# Patient Record
Sex: Male | Born: 1990 | Race: Black or African American | Hispanic: No | Marital: Single | Smoking: Current every day smoker
Health system: Southern US, Community
[De-identification: ages and names within clinical notes are randomized; demographics above are authoritative.]

## PROBLEM LIST (undated history)

## (undated) DIAGNOSIS — J45909 Unspecified asthma, uncomplicated: Secondary | ICD-10-CM

## (undated) DIAGNOSIS — E860 Dehydration: Secondary | ICD-10-CM

## (undated) HISTORY — PX: APPENDECTOMY: SHX54

---
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
## 2012-11-30 DIAGNOSIS — J45909 Unspecified asthma, uncomplicated: Secondary | ICD-10-CM | POA: Insufficient documentation

## 2012-11-30 DIAGNOSIS — Z9089 Acquired absence of other organs: Secondary | ICD-10-CM | POA: Insufficient documentation

## 2012-11-30 DIAGNOSIS — R1013 Epigastric pain: Secondary | ICD-10-CM | POA: Insufficient documentation

## 2012-11-30 DIAGNOSIS — R112 Nausea with vomiting, unspecified: Secondary | ICD-10-CM | POA: Insufficient documentation

## 2012-12-01 ENCOUNTER — Emergency Department (HOSPITAL_COMMUNITY)
Admission: EM | Admit: 2012-12-01 | Discharge: 2012-12-01 | Disposition: A | Discharge status: Home / Self Care | Payer: Self-pay | Attending: Emergency Medicine | Admitting: Emergency Medicine

## 2012-12-01 ENCOUNTER — Encounter (HOSPITAL_COMMUNITY): Payer: Self-pay | Admitting: Emergency Medicine

## 2012-12-01 ENCOUNTER — Emergency Department (HOSPITAL_COMMUNITY): Payer: Self-pay

## 2012-12-01 DIAGNOSIS — R1013 Epigastric pain: Secondary | ICD-10-CM

## 2012-12-01 HISTORY — DX: Unspecified asthma, uncomplicated: J45.909

## 2012-12-01 LAB — CBC WITH DIFFERENTIAL/PLATELET
Basophils Absolute: 0 10*3/uL (ref 0.0–0.1)
Basophils Relative: 0 % (ref 0–1)
Eosinophils Absolute: 0 10*3/uL (ref 0.0–0.7)
Eosinophils Relative: 0 % (ref 0–5)
HCT: 43.4 % (ref 39.0–52.0)
Hemoglobin: 15.1 g/dL (ref 13.0–17.0)
Lymphocytes Relative: 8 % — ABNORMAL LOW (ref 12–46)
Lymphs Abs: 1 10*3/uL (ref 0.7–4.0)
MCH: 28.9 pg (ref 26.0–34.0)
MCHC: 34.8 g/dL (ref 30.0–36.0)
MCV: 83.1 fL (ref 78.0–100.0)
Monocytes Absolute: 1 10*3/uL (ref 0.1–1.0)
Monocytes Relative: 8 % (ref 3–12)
Neutro Abs: 10.9 10*3/uL — ABNORMAL HIGH (ref 1.7–7.7)
Neutrophils Relative %: 84 % — ABNORMAL HIGH (ref 43–77)
Platelets: 250 10*3/uL (ref 150–400)
RBC: 5.22 MIL/uL (ref 4.22–5.81)
RDW: 13.7 % (ref 11.5–15.5)
WBC: 13 10*3/uL — ABNORMAL HIGH (ref 4.0–10.5)

## 2012-12-01 LAB — COMPREHENSIVE METABOLIC PANEL
ALT: 37 U/L (ref 0–53)
AST: 40 U/L — ABNORMAL HIGH (ref 0–37)
Albumin: 4.6 g/dL (ref 3.5–5.2)
Alkaline Phosphatase: 74 U/L (ref 39–117)
BUN: 14 mg/dL (ref 6–23)
CO2: 27 mEq/L (ref 19–32)
Calcium: 9.8 mg/dL (ref 8.4–10.5)
Chloride: 99 mEq/L (ref 96–112)
Creatinine, Ser: 0.93 mg/dL (ref 0.50–1.35)
GFR calc Af Amer: >90 mL/min (ref >90)
GFR calc non Af Amer: >90 mL/min (ref >90)
Glucose, Bld: 103 mg/dL — ABNORMAL HIGH (ref 70–99)
Potassium: 3.5 mEq/L (ref 3.5–5.1)
Sodium: 140 mEq/L (ref 135–145)
Total Bilirubin: 0.4 mg/dL (ref 0.3–1.2)
Total Protein: 8.7 g/dL — ABNORMAL HIGH (ref 6.0–8.3)

## 2012-12-01 MED ORDER — FAMOTIDINE 20 MG PO TABS: 20.0000 mg | ORAL_TABLET | Freq: Two times a day (BID) | ORAL | Status: DC

## 2012-12-01 MED ORDER — GI COCKTAIL ~~LOC~~
30.0000 mL | Freq: Once | ORAL | Status: AC
  Administered 2012-12-01: 30 mL via ORAL
  Filled 2012-12-01: qty 30

## 2012-12-01 MED ORDER — SODIUM CHLORIDE 0.9 % IV BOLUS (SEPSIS)
1000.0000 mL | Freq: Once | INTRAVENOUS | Status: AC
  Administered 2012-12-01: 1000 mL via INTRAVENOUS

## 2012-12-01 MED ORDER — PANTOPRAZOLE SODIUM 40 MG IV SOLR
40.0000 mg | Freq: Once | INTRAVENOUS | Status: AC
  Administered 2012-12-01: 40 mg via INTRAVENOUS
  Filled 2012-12-01: qty 40

## 2012-12-01 MED ORDER — FAMOTIDINE 20 MG PO TABS
20.0000 mg | ORAL_TABLET | Freq: Once | ORAL | Status: AC
  Administered 2012-12-01: 20 mg via ORAL
  Filled 2012-12-01: qty 1

## 2012-12-01 MED ORDER — ONDANSETRON HCL 4 MG/2ML IJ SOLN
4.0000 mg | Freq: Once | INTRAMUSCULAR | Status: AC
  Administered 2012-12-01: 4 mg via INTRAVENOUS
  Filled 2012-12-01: qty 2

## 2012-12-01 MED ORDER — MORPHINE SULFATE 4 MG/ML IJ SOLN
4.0000 mg | Freq: Once | INTRAMUSCULAR | Status: AC
  Administered 2012-12-01: 4 mg via INTRAVENOUS
  Filled 2012-12-01: qty 1

## 2012-12-01 NOTE — ED Provider Notes (Signed)
History     CSN: [id number]  Arrival date & time 11/30/12  2358   First MD Initiated Contact with Patient 12/01/12 0130      Chief Complaint  Patient presents with  . Abdominal Pain    (Consider location/radiation/quality/duration/timing/severity/associated sxs/prior treatment) HPI Hx per PT. Epigastric pain onset tonight after eating [employer] chicken meal, feels like indigestion, some nausea and vomiting x 4, no blood. No diarrhea. Now persistent sharp pain, no back pain. Mod in severity. No h/o same.  Past Medical History  Diagnosis Date  . Asthma     Past Surgical History  Procedure Laterality Date  . Appendectomy      No family history on file.  History  Substance Use Topics  . Smoking status: Never Smoker   . Smokeless tobacco: Not on file  . Alcohol Use: Yes     Comment: occasionally      Review of Systems  Constitutional: Negative for fever and chills.  HENT: Negative for neck pain and neck stiffness.   Eyes: Negative for pain.  Respiratory: Negative for shortness of breath.   Cardiovascular: Negative for chest pain.  Gastrointestinal: Positive for vomiting and abdominal pain. Negative for blood in stool.  Genitourinary: Negative for dysuria.  Musculoskeletal: Negative for back pain.  Skin: Negative for rash.  Neurological: Negative for headaches.  All other systems reviewed and are negative.    Allergies  Review of patient's allergies indicates not on file.  Home Medications  No current outpatient prescriptions on file.  BP 132/72  Pulse 92  Temp(Src) 97.5 F (36.4 C) (Oral)  Resp 16  SpO2 100%  Physical Exam  Constitutional: He is oriented to person, place, and time. He appears well-developed and well-nourished.  HENT:  Head: Normocephalic and atraumatic.  Eyes: Conjunctivae and EOM are normal. Pupils are equal, round, and reactive to light. No scleral icterus.  Neck: Neck supple.  Cardiovascular: Normal rate, regular rhythm and intact  distal pulses.   Pulmonary/Chest: Effort normal and breath sounds normal. No respiratory distress.  Abdominal: Soft. Bowel sounds are normal. He exhibits no distension. There is no rebound and no guarding.  TTP epigastric  Musculoskeletal: Normal range of motion. He exhibits no edema.  Neurological: He is alert and oriented to person, place, and time.  Skin: Skin is warm and dry.    ED Course  Procedures (including critical care time)  Results for orders placed during the hospital encounter of 12/01/12  CBC WITH DIFFERENTIAL      Result Value Range   WBC 13.0 (*) 4.0 - 10.5 K/uL   RBC 5.22  4.22 - 5.81 MIL/uL   Hemoglobin 15.1  13.0 - 17.0 g/dL   HCT 14.7  82.9 - 56.2 %   MCV 83.1  78.0 - 100.0 fL   MCH 28.9  26.0 - 34.0 pg   MCHC 34.8  30.0 - 36.0 g/dL   RDW 13.0  86.5 - 78.4 %   Platelets 250  150 - 400 K/uL   Neutrophils Relative 84 (*) 43 - 77 %   Neutro Abs 10.9 (*) 1.7 - 7.7 K/uL   Lymphocytes Relative 8 (*) 12 - 46 %   Lymphs Abs 1.0  0.7 - 4.0 K/uL   Monocytes Relative 8  3 - 12 %   Monocytes Absolute 1.0  0.1 - 1.0 K/uL   Eosinophils Relative 0  0 - 5 %   Eosinophils Absolute 0.0  0.0 - 0.7 K/uL   Basophils Relative 0  0 - 1 %   Basophils Absolute 0.0  0.0 - 0.1 K/uL  COMPREHENSIVE METABOLIC PANEL      Result Value Range   Sodium 140  135 - 145 mEq/L   Potassium 3.5  3.5 - 5.1 mEq/L   Chloride 99  96 - 112 mEq/L   CO2 27  19 - 32 mEq/L   Glucose, Bld 103 (*) 70 - 99 mg/dL   BUN 14  6 - 23 mg/dL   Creatinine, Ser 4.09  0.50 - 1.35 mg/dL   Calcium 9.8  8.4 - 81.1 mg/dL   Total Protein 8.7 (*) 6.0 - 8.3 g/dL   Albumin 4.6  3.5 - 5.2 g/dL   AST 40 (*) 0 - 37 U/L   ALT 37  0 - 53 U/L   Alkaline Phosphatase 74  39 - 117 U/L   Total Bilirubin 0.4  0.3 - 1.2 mg/dL   GFR calc non Af Amer >90  >90 mL/min   GFR calc Af Amer >90  >90 mL/min  LIPASE, BLOOD      Result Value Range   Lipase 26  11 - 59 U/L   US Abdomen Complete  12/01/2012  *RADIOLOGY REPORT*   Clinical Data:  Upper abdominal pain.  ABDOMINAL ULTRASOUND COMPLETE  Comparison:  None  Findings:  Gallbladder:  The gallbladder is normal in appearance, without evidence for gallstones, gallbladder wall thickening or pericholecystic fluid.  No ultrasonographic Murphy's sign is elicited.  A small Phrygian cap is noted.  Common Bile Duct:  0.3 cm in diameter; within normal limits in caliber.  Liver:  Normal parenchymal echogenicity and echotexture; no focal lesions identified.  Limited Doppler evaluation demonstrates normal blood flow within the liver.  IVC:  Unremarkable in appearance.  Pancreas:  Although the pancreas is difficult to visualize in its entirety due to overlying bowel gas, no focal pancreatic abnormality is identified.  Spleen:  9.9 cm in length; within normal limits in size and echotexture.  Right kidney:  10.2 cm in length; normal in size, configuration and parenchymal echogenicity.  No evidence of mass or hydronephrosis.  Left kidney:  10.5 cm in length; normal in size, configuration and parenchymal echogenicity.  No evidence of mass or hydronephrosis.  Abdominal Aorta:  Normal in caliber; no aneurysm identified.  IMPRESSION: Unremarkable abdominal ultrasound.   Original Report Authenticated By: Tonia Ghent, M.D.     IV fluids. IV morphine. IV Zofran. IV Protonix. GI cocktail. Pepcid  3:37 AM recheck: feeling better, especially after GI cocktail. Plan d/c home Rx peppcid, GERD precautions, GI referral as needed MDM  Epigastric pain  [place]. Labs reviewed  Improved with medications provided  VS and nursing notes reviewed/ considered       Sunnie Nielsen, MD 12/01/12 [phone]

## 2012-12-01 NOTE — ED Notes (Signed)
Pt transported to US

## 2012-12-01 NOTE — ED Notes (Signed)
Pt abdominal pain with N/V x 2 hours, not long after eating

## 2012-12-01 NOTE — ED Notes (Signed)
MD at the bedside  

## 2013-12-02 ENCOUNTER — Encounter (HOSPITAL_COMMUNITY): Payer: Self-pay | Admitting: Emergency Medicine

## 2013-12-02 ENCOUNTER — Emergency Department (INDEPENDENT_AMBULATORY_CARE_PROVIDER_SITE_OTHER)
Admission: EM | Admit: 2013-12-02 | Discharge: 2013-12-02 | Disposition: A | Discharge status: Home / Self Care | Payer: Self-pay | Source: Home / Self Care

## 2013-12-02 ENCOUNTER — Emergency Department (HOSPITAL_COMMUNITY)
Admission: EM | Admit: 2013-12-02 | Discharge: 2013-12-02 | Discharge status: Against Medical Advice | Payer: Self-pay | Attending: Emergency Medicine | Admitting: Emergency Medicine

## 2013-12-02 DIAGNOSIS — R112 Nausea with vomiting, unspecified: Secondary | ICD-10-CM

## 2013-12-02 DIAGNOSIS — K2990 Gastroduodenitis, unspecified, without bleeding: Secondary | ICD-10-CM

## 2013-12-02 DIAGNOSIS — R630 Anorexia: Secondary | ICD-10-CM | POA: Insufficient documentation

## 2013-12-02 DIAGNOSIS — K2970 Gastritis, unspecified, without bleeding: Secondary | ICD-10-CM

## 2013-12-02 DIAGNOSIS — J45909 Unspecified asthma, uncomplicated: Secondary | ICD-10-CM | POA: Insufficient documentation

## 2013-12-02 DIAGNOSIS — R109 Unspecified abdominal pain: Secondary | ICD-10-CM | POA: Insufficient documentation

## 2013-12-02 LAB — CBC WITH DIFFERENTIAL/PLATELET
Basophils Absolute: 0 K/uL (ref 0.0–0.1)
Basophils Relative: 0 % (ref 0–1)
Eosinophils Absolute: 0 K/uL (ref 0.0–0.7)
Eosinophils Relative: 0 % (ref 0–5)
HCT: 44.8 % (ref 39.0–52.0)
Hemoglobin: 15.4 g/dL (ref 13.0–17.0)
Lymphocytes Relative: 14 % (ref 12–46)
Lymphs Abs: 1.2 K/uL (ref 0.7–4.0)
MCH: 29.2 pg (ref 26.0–34.0)
MCHC: 34.4 g/dL (ref 30.0–36.0)
MCV: 84.8 fL (ref 78.0–100.0)
Monocytes Absolute: 0.4 K/uL (ref 0.1–1.0)
Monocytes Relative: 4 % (ref 3–12)
Neutro Abs: 7.5 K/uL (ref 1.7–7.7)
Neutrophils Relative %: 82 % — ABNORMAL HIGH (ref 43–77)
Platelets: 259 K/uL (ref 150–400)
RBC: 5.28 MIL/uL (ref 4.22–5.81)
RDW: 13 % (ref 11.5–15.5)
WBC: 9.2 K/uL (ref 4.0–10.5)

## 2013-12-02 LAB — COMPREHENSIVE METABOLIC PANEL
ALK PHOS: 88 U/L (ref 39–117)
ALT: 19 U/L (ref 0–53)
AST: 24 U/L (ref 0–37)
Albumin: 5 g/dL (ref 3.5–5.2)
BILIRUBIN TOTAL: 0.5 mg/dL (ref 0.3–1.2)
BUN: 12 mg/dL (ref 6–23)
CHLORIDE: 99 meq/L (ref 96–112)
CO2: 29 meq/L (ref 19–32)
Calcium: 10.4 mg/dL (ref 8.4–10.5)
Creatinine, Ser: 0.85 mg/dL (ref 0.50–1.35)
GFR calc Af Amer: >90 mL/min (ref >90)
GLUCOSE: 93 mg/dL (ref 70–99)
POTASSIUM: 3.9 meq/L (ref 3.7–5.3)
SODIUM: 141 meq/L (ref 137–147)
Total Protein: 8.9 g/dL — ABNORMAL HIGH (ref 6.0–8.3)

## 2013-12-02 LAB — LIPASE, BLOOD: Lipase: 55 U/L (ref 11–59)

## 2013-12-02 MED ORDER — OMEPRAZOLE 20 MG PO CPDR: 20.0000 mg | DELAYED_RELEASE_CAPSULE | Freq: Every day | ORAL | Status: DC

## 2013-12-02 MED ORDER — ONDANSETRON HCL 4 MG PO TABS: 4.0000 mg | ORAL_TABLET | Freq: Four times a day (QID) | ORAL | Status: DC

## 2013-12-02 NOTE — ED Notes (Signed)
States had had a "few alcoholic drinks last night' then today became nauseated and began vomiting and has been unable to tolerate any oral intake since. Every time he tries to eat or drink he vomits and hes having upper abd pain

## 2013-12-02 NOTE — ED Notes (Signed)
Pt states also has a hx of irregular BM.  States goes once every 5 days.  Mw,cma

## 2013-12-02 NOTE — ED Notes (Signed)
Called pt to retake vital signs. No one answered to his name.

## 2013-12-02 NOTE — ED Provider Notes (Signed)
Medical screening examination/treatment/procedure(s) were performed by resident physician or non-physician practitioner and as supervising physician I was immediately available for consultation/collaboration.   Curry Dulski DOUGLAS MD.   Aime Meloche D Kingslee Dowse, MD 12/02/13 1708 

## 2013-12-02 NOTE — ED Provider Notes (Signed)
CSN: [id number]     Arrival date & time 12/02/13  1412 History   First MD Initiated Contact with Patient 12/02/13 1440     Chief Complaint  Patient presents with  . Abdominal Pain   (Consider location/radiation/quality/duration/timing/severity/associated sxs/prior Treatment) HPI Comments: 23 year old male coming back from the beach in the last 24 hours and developed epigastric discomfort associated with nausea and vomiting approximately 2 hours ago. He denies diarrhea. Denies fever, chest pain or other symptoms. He does have a history of asthma.   Past Medical History  Diagnosis Date  . Asthma    Past Surgical History  Procedure Laterality Date  . Appendectomy     History reviewed. No pertinent family history. History  Substance Use Topics  . Smoking status: Never Smoker   . Smokeless tobacco: Not on file  . Alcohol Use: Yes     Comment: occasionally    Review of Systems  Constitutional: Negative.   Respiratory: Negative.   Cardiovascular: Negative.   Gastrointestinal: Positive for nausea, vomiting, abdominal pain and constipation. Negative for diarrhea and abdominal distention.  Genitourinary: Negative.   Neurological: Negative.     Allergies  Robitussin nighttime cough dm  Home Medications   Current Outpatient Rx  Name  Route  Sig  Dispense  Refill  . famotidine (PEPCID) 20 MG tablet   Oral   Take 1 tablet (20 mg total) by mouth 2 (two) times daily.   30 tablet   0    BP 115/68  Pulse 63  Temp(Src) 97.6 F (36.4 C) (Oral)  Resp 20  SpO2 100% Physical Exam  Nursing note and vitals reviewed. Constitutional: He appears well-developed and well-nourished. No distress.  Eyes: Conjunctivae and EOM are normal.  Neck: Normal range of motion. Neck supple.  Cardiovascular: Normal rate, regular rhythm and normal heart sounds.   Pulmonary/Chest: Effort normal and breath sounds normal. No respiratory distress. He has no wheezes.  Abdominal: Soft. Bowel sounds are  normal. He exhibits no distension and no mass. There is tenderness. There is no rebound and no guarding.  Tenderness to the epigastrium. No tenderness elsewhere.  Musculoskeletal: He exhibits no edema and no tenderness.  Neurological: He is alert. He exhibits normal muscle tone.  Skin: Skin is warm and dry. No rash noted.  Psychiatric: He has a normal mood and affect.    ED Course  Procedures (including critical care time) Labs Review Labs Reviewed - No data to display Imaging Review No results found.   MDM   1. Gastritis   2. Nausea and vomiting     Most likely this is due from an infectious gastritis he may have picked up from the beach. He did have a portion of an alcoholic drink last night at 1 AM. He did not start feeling sick until several hours later. Zofran as directed Omeprazole 20 mg daily for 2 weeks. Clear liquids for the next 24 hours then slowly advance to full liquids and solids as tolerated.   Hayden Rasmussenavid Yordi Krager, NP 12/02/13 [phone]

## 2013-12-02 NOTE — ED Notes (Signed)
C/o abdominal pain.   On the way home from beach started vomiting.  States started off as hunger pain then nausea feeling and vomiting.  No change in diet.

## 2013-12-02 NOTE — Discharge Instructions (Signed)
Gastritis, Adult Gastritis is soreness and swelling (inflammation) of the lining of the stomach. Gastritis can develop as a sudden onset (acute) or long-term (chronic) condition. If gastritis is not treated, it can lead to stomach bleeding and ulcers. CAUSES  Gastritis occurs when the stomach lining is weak or damaged. Digestive juices from the stomach then inflame the weakened stomach lining. The stomach lining may be weak or damaged due to viral or bacterial infections. One common bacterial infection is the Helicobacter pylori infection. Gastritis can also result from excessive alcohol consumption, taking certain medicines, or having too much acid in the stomach.  SYMPTOMS  In some cases, there are no symptoms. When symptoms are present, they may include:  Pain or a burning sensation in the upper abdomen.  Nausea.  Vomiting.  An uncomfortable feeling of fullness after eating. DIAGNOSIS  Your caregiver may suspect you have gastritis based on your symptoms and a physical exam. To determine the cause of your gastritis, your caregiver may perform the following:  Blood or stool tests to check for the H pylori bacterium.  Gastroscopy. A thin, flexible tube (endoscope) is passed down the esophagus and into the stomach. The endoscope has a light and camera on the end. Your caregiver uses the endoscope to view the inside of the stomach.  Taking a tissue sample (biopsy) from the stomach to examine under a microscope. TREATMENT  Depending on the cause of your gastritis, medicines may be prescribed. If you have a bacterial infection, such as an H pylori infection, antibiotics may be given. If your gastritis is caused by too much acid in the stomach, H2 blockers or antacids may be given. Your caregiver may recommend that you stop taking aspirin, ibuprofen, or other nonsteroidal anti-inflammatory drugs (NSAIDs). HOME CARE INSTRUCTIONS  Only take over-the-counter or prescription medicines as directed by  your caregiver.  If you were given antibiotic medicines, take them as directed. Finish them even if you start to feel better.  Drink enough fluids to keep your urine clear or pale yellow.  Avoid foods and drinks that make your symptoms worse, such as:  Caffeine or alcoholic drinks.  Chocolate.  Peppermint or mint flavorings.  Garlic and onions.  Spicy foods.  Citrus fruits, such as oranges, lemons, or limes.  Tomato-based foods such as sauce, chili, salsa, and pizza.  Fried and fatty foods.  Eat small, frequent meals instead of large meals. SEEK IMMEDIATE MEDICAL CARE IF:   You have black or dark red stools.  You vomit blood or material that looks like coffee grounds.  You are unable to keep fluids down.  Your abdominal pain gets worse.  You have a fever.  You do not feel better after 1 week.  You have any other questions or concerns. MAKE SURE YOU:  Understand these instructions.  Will watch your condition.  Will get help right away if you are not doing well or get worse. Document Released: 09/09/2001 Document Revised: 03/16/2012 Document Reviewed: 10/29/2011 [facility] Patient Information 2014 [place].  Nausea and Vomiting Nausea means you feel sick to your stomach. Throwing up (vomiting) is a reflex where stomach contents come out of your mouth. HOME CARE   Take medicine as told by your doctor.  Do not force yourself to eat. However, you do need to drink fluids.  If you feel like eating, eat a normal diet as told by your doctor.  Eat rice, wheat, potatoes, bread, lean meats, yogurt, fruits, and vegetables.  Avoid high-fat foods.  Drink  enough fluids to keep your pee (urine) clear or pale yellow.  Ask your doctor how to replace body fluid losses (rehydrate). Signs of body fluid loss (dehydration) include:  Feeling very thirsty.  Dry lips and mouth.  Feeling dizzy.  Dark pee.  Peeing less than normal.  Feeling confused.  Fast  breathing or heart rate. GET HELP RIGHT AWAY IF:   You have blood in your throw up.  You have black or bloody poop (stool).  You have a bad headache or stiff neck.  You feel confused.  You have bad belly (abdominal) pain.  You have chest pain or trouble breathing.  You do not pee at least once every 8 hours.  You have cold, clammy skin.  You keep throwing up after 24 to 48 hours.  You have a fever. MAKE SURE YOU:   Understand these instructions.  Will watch your condition.  Will get help right away if you are not doing well or get worse. Document Released: 03/03/2008 Document Revised: 12/08/2011 Document Reviewed: 02/14/2011 [facility] Patient Information 2014 [place], [place].

## 2014-04-23 ENCOUNTER — Emergency Department (HOSPITAL_COMMUNITY)
Admission: EM | Admit: 2014-04-23 | Discharge: 2014-04-24 | Disposition: A | Discharge status: Home / Self Care | Payer: Self-pay | Attending: Emergency Medicine | Admitting: Emergency Medicine

## 2014-04-23 ENCOUNTER — Encounter (HOSPITAL_COMMUNITY): Payer: Self-pay | Admitting: Emergency Medicine

## 2014-04-23 DIAGNOSIS — Y92838 Other recreation area as the place of occurrence of the external cause: Secondary | ICD-10-CM

## 2014-04-23 DIAGNOSIS — R259 Unspecified abnormal involuntary movements: Secondary | ICD-10-CM | POA: Insufficient documentation

## 2014-04-23 DIAGNOSIS — X30XXXA Exposure to excessive natural heat, initial encounter: Secondary | ICD-10-CM | POA: Insufficient documentation

## 2014-04-23 DIAGNOSIS — Y9367 Activity, basketball: Secondary | ICD-10-CM | POA: Insufficient documentation

## 2014-04-23 DIAGNOSIS — E860 Dehydration: Secondary | ICD-10-CM

## 2014-04-23 DIAGNOSIS — Y9239 Other specified sports and athletic area as the place of occurrence of the external cause: Secondary | ICD-10-CM | POA: Insufficient documentation

## 2014-04-23 DIAGNOSIS — J45909 Unspecified asthma, uncomplicated: Secondary | ICD-10-CM | POA: Insufficient documentation

## 2014-04-23 DIAGNOSIS — T675XXA Heat exhaustion, unspecified, initial encounter: Secondary | ICD-10-CM

## 2014-04-23 DIAGNOSIS — Z79899 Other long term (current) drug therapy: Secondary | ICD-10-CM | POA: Insufficient documentation

## 2014-04-23 HISTORY — DX: Dehydration: E86.0

## 2014-04-23 MED ORDER — SODIUM CHLORIDE 0.9 % IV BOLUS (SEPSIS)
1000.0000 mL | Freq: Once | INTRAVENOUS | Status: AC
  Administered 2014-04-24: 1000 mL via INTRAVENOUS

## 2014-04-23 NOTE — ED Notes (Signed)
Pt states he is dehydrated and having muscle spasms.

## 2014-04-23 NOTE — ED Notes (Addendum)
Patient able to ambulate from ED waiting room to ED room 17 without any difficulty--gait steady  No spasms or MS abnormalities present Patient in NAD

## 2014-04-23 NOTE — ED Provider Notes (Signed)
CSN: [id number]     Arrival date & time 04/23/14  2253 History   First MD Initiated Contact with Patient 04/23/14 2331     Chief Complaint  Patient presents with  . Spasms     (Consider location/radiation/quality/duration/timing/severity/associated sxs/prior Treatment) HPI  23 year old male with history of asthma and dehydration in the past presents complaining of feeling dehydrated.patient states he was outside playing basketball in the sun for probably 4-5 hours. Afterward he reported feeling very weak, tired having muscle cramping and felt nauseous. He has vomited 3-4 times and unable to keep anything down. He feels dehydrated. Patient states he did not eat and drink much today. He has had similar episodes of dehydration the past requiring IV fluid. Otherwise he denies any recent sickness, no fever, no chills, no headache, chest pain, shortness of breath, productive cough, or rash. Patient otherwise without any other complaints. No recent sickness.  Past Medical History  Diagnosis Date  . Asthma   . Dehydration    Past Surgical History  Procedure Laterality Date  . Appendectomy     No family history on file. History  Substance Use Topics  . Smoking status: Never Smoker   . Smokeless tobacco: Never Used  . Alcohol Use: Yes     Comment: occasionally    Review of Systems  All other systems reviewed and are negative.     Allergies  Robitussin nighttime cough dm  Home Medications   Prior to Admission medications   Medication Sig Start Date End Date Taking? Authorizing Provider  famotidine (PEPCID) 20 MG tablet Take 1 tablet (20 mg total) by mouth 2 (two) times daily. 12/01/12   Sunnie Nielsen, MD  omeprazole (PRILOSEC) 20 MG capsule Take 1 capsule (20 mg total) by mouth daily. 12/02/13   Hayden Rasmussen, NP  ondansetron (ZOFRAN) 4 MG tablet Take 1 tablet (4 mg total) by mouth every 6 (six) hours. 12/02/13   Hayden Rasmussen, NP   BP 114/39  Pulse 81  Temp(Src) 97.8 F (36.6 C) (Oral)   Resp 14  Ht 5\' 8"  (1.727 m)  Wt 145 lb (65.772 kg)  BMI 22.05 kg/m2  SpO2 100% Physical Exam  Constitutional: He is oriented to person, place, and time. He appears well-developed and well-nourished. No distress.  HENT:  Head: Atraumatic.  Oral mucosa dry  Eyes: Conjunctivae are normal.  Neck: Normal range of motion. Neck supple.  Cardiovascular: Normal rate, regular rhythm and intact distal pulses.   Pulmonary/Chest: Effort normal and breath sounds normal.  Abdominal: Soft. There is no tenderness.  Musculoskeletal:  5/5 strength to all 4 extremities.    DTRs normal  Neurological: He is alert and oriented to person, place, and time. He has normal reflexes.  Skin: No rash noted.  Psychiatric: He has a normal mood and affect.    ED Course  Procedures (including critical care time)  12:47 AM Pt with heat exhaustion and dehydration from playing basketball for 5 hrs in the sun.  He is hemodynamically stable.  IVF started, will monitor.    12:47 AM Evidence of renal insuffiency likely 2/2 dehydration.    1:37 AM Patient felt much better after IV fluid. He is able to tolerates by mouth. His date of discharge. Recommend fluid hydration at home.  Labs Review Labs Reviewed  COMPREHENSIVE METABOLIC PANEL - Abnormal; Notable for the following:    Potassium 3.6 (*)    Chloride 95 (*)    Glucose, Bld 111 (*)    Creatinine, Ser 1.39 (*)  Total Protein 8.9 (*)    GFR calc non Af Amer 71 (*)    GFR calc Af Amer 83 (*)    Anion gap 19 (*)    All other components within normal limits  CK - Abnormal; Notable for the following:    Total CK 493 (*)    All other components within normal limits  CBC WITH DIFFERENTIAL  LIPASE, BLOOD  URINALYSIS, ROUTINE W REFLEX MICROSCOPIC    Imaging Review No results found.   EKG Interpretation None      MDM   Final diagnoses:  Dehydration  Heat exhaustion, initial encounter    BP 118/50  Pulse 90  Temp(Src) 97.9 F (36.6 C) (Oral)   Resp 16  Ht 5\' 8"  (1.727 m)  Wt 145 lb (65.772 kg)  BMI 22.05 kg/m2  SpO2 99%     Fayrene HelperBowie Lyndzie Zentz, PA-C 04/24/14 0146

## 2014-04-24 LAB — COMPREHENSIVE METABOLIC PANEL
ALT: 20 U/L (ref 0–53)
ANION GAP: 19 — AB (ref 5–15)
AST: 29 U/L (ref 0–37)
Albumin: 5.1 g/dL (ref 3.5–5.2)
Alkaline Phosphatase: 87 U/L (ref 39–117)
BUN: 19 mg/dL (ref 6–23)
CALCIUM: 10.4 mg/dL (ref 8.4–10.5)
CO2: 24 mEq/L (ref 19–32)
Chloride: 95 mEq/L — ABNORMAL LOW (ref 96–112)
Creatinine, Ser: 1.39 mg/dL — ABNORMAL HIGH (ref 0.50–1.35)
GFR calc non Af Amer: 71 mL/min — ABNORMAL LOW (ref >90)
GFR, EST AFRICAN AMERICAN: 83 mL/min — AB (ref >90)
GLUCOSE: 111 mg/dL — AB (ref 70–99)
Potassium: 3.6 mEq/L — ABNORMAL LOW (ref 3.7–5.3)
SODIUM: 138 meq/L (ref 137–147)
TOTAL PROTEIN: 8.9 g/dL — AB (ref 6.0–8.3)
Total Bilirubin: 0.6 mg/dL (ref 0.3–1.2)

## 2014-04-24 LAB — CBC WITH DIFFERENTIAL/PLATELET
BASOS PCT: 0 % (ref 0–1)
Basophils Absolute: 0 10*3/uL (ref 0.0–0.1)
EOS ABS: 0 10*3/uL (ref 0.0–0.7)
EOS PCT: 0 % (ref 0–5)
HCT: 43.9 % (ref 39.0–52.0)
Hemoglobin: 15.1 g/dL (ref 13.0–17.0)
LYMPHS ABS: 2.1 10*3/uL (ref 0.7–4.0)
Lymphocytes Relative: 20 % (ref 12–46)
MCH: 28.7 pg (ref 26.0–34.0)
MCHC: 34.4 g/dL (ref 30.0–36.0)
MCV: 83.5 fL (ref 78.0–100.0)
Monocytes Absolute: 0.9 10*3/uL (ref 0.1–1.0)
Monocytes Relative: 9 % (ref 3–12)
Neutro Abs: 7.5 10*3/uL (ref 1.7–7.7)
Neutrophils Relative %: 71 % (ref 43–77)
PLATELETS: 290 10*3/uL (ref 150–400)
RBC: 5.26 MIL/uL (ref 4.22–5.81)
RDW: 12.9 % (ref 11.5–15.5)
WBC: 10.5 10*3/uL (ref 4.0–10.5)

## 2014-04-24 LAB — CK: CK TOTAL: 493 U/L — AB (ref 7–232)

## 2014-04-24 LAB — LIPASE, BLOOD: Lipase: 19 U/L (ref 11–59)

## 2014-04-24 MED ORDER — ONDANSETRON HCL 4 MG/2ML IJ SOLN
4.0000 mg | Freq: Once | INTRAMUSCULAR | Status: AC
  Administered 2014-04-24: 4 mg via INTRAVENOUS
  Filled 2014-04-24: qty 2

## 2014-04-24 MED ORDER — MORPHINE SULFATE 4 MG/ML IJ SOLN
4.0000 mg | Freq: Once | INTRAMUSCULAR | Status: AC
  Administered 2014-04-24: 4 mg via INTRAVENOUS
  Filled 2014-04-24: qty 1

## 2014-04-24 NOTE — ED Notes (Signed)
Patient informed of need for urine specimen--states that he voided PTA and is unable to give specimen at this time

## 2014-04-24 NOTE — ED Provider Notes (Signed)
Medical screening examination/treatment/procedure(s) were performed by non-physician practitioner and as supervising physician I was immediately available for consultation/collaboration.     Tymia Streb, MD 04/24/14 1535 

## 2014-04-24 NOTE — Discharge Instructions (Signed)
Dehydration, Adult Dehydration is when you lose more fluids from the body than you take in. Vital organs like the kidneys, brain, and heart cannot function without a proper amount of fluids and salt. Any loss of fluids from the body can cause dehydration.  CAUSES   Vomiting.  Diarrhea.  Excessive sweating.  Excessive urine output.  Fever. SYMPTOMS  Mild dehydration  Thirst.  Dry lips.  Slightly dry mouth. Moderate dehydration  Very dry mouth.  Sunken eyes.  Skin does not bounce back quickly when lightly pinched and released.  Dark urine and decreased urine production.  Decreased tear production.  Headache. Severe dehydration  Very dry mouth.  Extreme thirst.  Rapid, weak pulse (more than 100 beats per minute at rest).  Cold hands and feet.  Not able to sweat in spite of heat and temperature.  Rapid breathing.  Blue lips.  Confusion and lethargy.  Difficulty being awakened.  Minimal urine production.  No tears. DIAGNOSIS  Your caregiver will diagnose dehydration based on your symptoms and your exam. Blood and urine tests will help confirm the diagnosis. The diagnostic evaluation should also identify the cause of dehydration. TREATMENT  Treatment of mild or moderate dehydration can often be done at home by increasing the amount of fluids that you drink. It is best to drink small amounts of fluid more often. Drinking too much at one time can make vomiting worse. Refer to the home care instructions below. Severe dehydration needs to be treated at the hospital where you will probably be given intravenous (IV) fluids that contain water and electrolytes. HOME CARE INSTRUCTIONS   Ask your caregiver about specific rehydration instructions.  Drink enough fluids to keep your urine clear or pale yellow.  Drink small amounts frequently if you have nausea and vomiting.  Eat as you normally do.  Avoid:  Foods or drinks high in sugar.  Carbonated  drinks.  Juice.  Extremely hot or cold fluids.  Drinks with caffeine.  Fatty, greasy foods.  Alcohol.  Tobacco.  Overeating.  Gelatin desserts.  Wash your hands well to avoid spreading bacteria and viruses.  Only take over-the-counter or prescription medicines for pain, discomfort, or fever as directed by your caregiver.  Ask your caregiver if you should continue all prescribed and over-the-counter medicines.  Keep all follow-up appointments with your caregiver. SEEK MEDICAL CARE IF:  You have abdominal pain and it increases or stays in one area (localizes).  You have a rash, stiff neck, or severe headache.  You are irritable, sleepy, or difficult to awaken.  You are weak, dizzy, or extremely thirsty. SEEK IMMEDIATE MEDICAL CARE IF:   You are unable to keep fluids down or you get worse despite treatment.  You have frequent episodes of vomiting or diarrhea.  You have blood or green matter (bile) in your vomit.  You have blood in your stool or your stool looks black and tarry.  You have not urinated in 6 to 8 hours, or you have only urinated a small amount of very dark urine.  You have a fever.  You faint. MAKE SURE YOU:   Understand these instructions.  Will watch your condition.  Will get help right away if you are not doing well or get worse. Document Released: 09/15/2005 Document Revised: 12/08/2011 Document Reviewed: 05/05/2011 [employer] Patient Information 2015 [employer]. This information is not intended to replace advice given to you by your health care provider. Make sure you discuss any questions you have with your health care   provider.  

## 2014-09-25 ENCOUNTER — Encounter (HOSPITAL_COMMUNITY): Payer: Self-pay | Admitting: *Deleted

## 2014-09-25 ENCOUNTER — Emergency Department (HOSPITAL_COMMUNITY)
Admission: EM | Admit: 2014-09-25 | Discharge: 2014-09-25 | Disposition: A | Discharge status: Home / Self Care | Payer: Self-pay | Attending: Emergency Medicine | Admitting: Emergency Medicine

## 2014-09-25 DIAGNOSIS — J029 Acute pharyngitis, unspecified: Secondary | ICD-10-CM | POA: Insufficient documentation

## 2014-09-25 DIAGNOSIS — J45909 Unspecified asthma, uncomplicated: Secondary | ICD-10-CM | POA: Insufficient documentation

## 2014-09-25 DIAGNOSIS — Z79899 Other long term (current) drug therapy: Secondary | ICD-10-CM | POA: Insufficient documentation

## 2014-09-25 LAB — RAPID STREP SCREEN (MED CTR MEBANE ONLY): STREPTOCOCCUS, GROUP A SCREEN (DIRECT): NEGATIVE

## 2014-09-25 NOTE — ED Notes (Signed)
Pt states has had a sore throat x couple days, states he took a tylenol and a mucinex tonight and it upset his stomach also.

## 2014-09-25 NOTE — ED Provider Notes (Signed)
CSN: [id number]     Arrival date & time 09/25/14  0410 History   First MD Initiated Contact with Patient 09/25/14 0507     Chief Complaint  Patient presents with  . Sore Throat     (Consider location/radiation/quality/duration/timing/severity/associated sxs/prior Treatment) Patient is a 23 y.o. male presenting with pharyngitis. The history is provided by the patient. No language interpreter was used.  Sore Throat This is a new problem. The current episode started today. Associated symptoms include a sore throat. Pertinent negatives include no abdominal pain, chills, congestion, coughing, fever or vomiting. Associated symptoms comments: Sore throat that has been "scratchy" for the past several days but significantly sore tonight. No fever. No significant sinus or nasal congestion. .    Past Medical History  Diagnosis Date  . Asthma   . Dehydration    Past Surgical History  Procedure Laterality Date  . Appendectomy     No family history on file. History  Substance Use Topics  . Smoking status: Never Smoker   . Smokeless tobacco: Never Used  . Alcohol Use: Yes     Comment: occasionally    Review of Systems  Constitutional: Negative for fever and chills.  HENT: Positive for sore throat. Negative for congestion.   Respiratory: Negative.  Negative for cough.   Cardiovascular: Negative.   Gastrointestinal: Negative.  Negative for vomiting and abdominal pain.  Musculoskeletal: Negative.   Skin: Negative.   Neurological: Negative.       Allergies  Robitussin nighttime cough dm  Home Medications   Prior to Admission medications   Medication Sig Start Date End Date Taking? Authorizing Provider  acetaminophen (TYLENOL) 325 MG tablet Take 650 mg by mouth every 6 (six) hours as needed for mild pain.   Yes Historical Provider, MD  albuterol (PROVENTIL HFA;VENTOLIN HFA) 108 (90 BASE) MCG/ACT inhaler Inhale 1 puff into the lungs every 6 (six) hours as needed for wheezing or  shortness of breath.   Yes Historical Provider, MD  guaiFENesin (MUCINEX) 600 MG 12 hr tablet Take 600 mg by mouth 2 (two) times daily as needed for cough.   Yes Historical Provider, MD   BP 128/80 mmHg  Pulse 86  Temp(Src) 97.6 F (36.4 C) (Oral)  Resp 20  Ht 5\' 8"  (1.727 m)  Wt 150 lb (68.04 kg)  BMI 22.81 kg/m2  SpO2 100% Physical Exam  Constitutional: He is oriented to person, place, and time. He appears well-developed and well-nourished.  HENT:  Head: Normocephalic.  Mouth/Throat: Mucous membranes are not dry. Posterior oropharyngeal erythema present.  Neck: Normal range of motion. Neck supple.  Cardiovascular: Normal rate and regular rhythm.   Pulmonary/Chest: Effort normal and breath sounds normal.  Abdominal: Soft. Bowel sounds are normal. There is no tenderness. There is no rebound and no guarding.  Musculoskeletal: Normal range of motion.  Lymphadenopathy:    He has no cervical adenopathy.  Neurological: He is alert and oriented to person, place, and time.  Skin: Skin is warm and dry. No rash noted.  Psychiatric: He has a normal mood and affect.    ED Course  Procedures (including critical care time) Labs Review Labs Reviewed  RAPID STREP SCREEN  CULTURE, GROUP A STREP    Imaging Review No results found.   EKG Interpretation None      MDM   Final diagnoses:  None    1. Pharyngitis  He is well appearing, VSS. Negative strep test. Suspect viral sore throat. Recommend supportive care.  Arnoldo HookerShari A Madox Corkins, PA-C 09/25/14 [id number]  Hanley SeamenJohn L Molpus, MD 09/25/14 [phone]

## 2014-09-25 NOTE — Discharge Instructions (Signed)
Pharyngitis °Pharyngitis is redness, pain, and swelling (inflammation) of your pharynx.  °CAUSES  °Pharyngitis is usually caused by infection. Most of the time, these infections are from viruses (viral) and are part of a cold. However, sometimes pharyngitis is caused by bacteria (bacterial). Pharyngitis can also be caused by allergies. Viral pharyngitis may be spread from person to person by coughing, sneezing, and personal items or utensils (cups, forks, spoons, toothbrushes). Bacterial pharyngitis may be spread from person to person by more intimate contact, such as kissing.  °SIGNS AND SYMPTOMS  °Symptoms of pharyngitis include:   °· Sore throat.   °· Tiredness (fatigue).   °· Low-grade fever.   °· Headache. °· Joint pain and muscle aches. °· Skin rashes. °· Swollen lymph nodes. °· Plaque-like film on throat or tonsils (often seen with bacterial pharyngitis). °DIAGNOSIS  °Your health care provider will ask you questions about your illness and your symptoms. Your medical history, along with a physical exam, is often all that is needed to diagnose pharyngitis. Sometimes, a rapid strep test is done. Other lab tests may also be done, depending on the suspected cause.  °TREATMENT  °Viral pharyngitis will usually get better in 3-4 days without the use of medicine. Bacterial pharyngitis is treated with medicines that kill germs (antibiotics).  °HOME CARE INSTRUCTIONS  °· Drink enough water and fluids to keep your urine clear or pale yellow.   °· Only take over-the-counter or prescription medicines as directed by your health care provider:   °¨ If you are prescribed antibiotics, make sure you finish them even if you start to feel better.   °¨ Do not take aspirin.   °· Get lots of rest.   °· Gargle with 8 oz of salt water (½ tsp of salt per 1 qt of water) as often as every 1-2 hours to soothe your throat.   °· Throat lozenges (if you are not at risk for choking) or sprays may be used to soothe your throat. °SEEK MEDICAL  CARE IF:  °· You have large, tender lumps in your neck. °· You have a rash. °· You cough up green, yellow-brown, or bloody spit. °SEEK IMMEDIATE MEDICAL CARE IF:  °· Your neck becomes stiff. °· You drool or are unable to swallow liquids. °· You vomit or are unable to keep medicines or liquids down. °· You have severe pain that does not go away with the use of recommended medicines. °· You have trouble breathing (not caused by a stuffy nose). °MAKE SURE YOU:  °· Understand these instructions. °· Will watch your condition. °· Will get help right away if you are not doing well or get worse. °Document Released: 09/15/2005 Document Revised: 07/06/2013 Document Reviewed: 05/23/2013 °ExitCare® Patient Information ©2015 [employer]. This information is not intended to replace advice given to you by your health care provider. Make sure you discuss any questions you have with your health care provider. ° °Salt Water Gargle °This solution will help make your mouth and throat feel better. °HOME CARE INSTRUCTIONS  °· Mix 1 teaspoon of salt in 8 ounces of warm water. °· Gargle with this solution as much or often as you need or as directed. Swish and gargle gently if you have any sores or wounds in your mouth. °· Do not swallow this mixture. °Document Released: 06/19/2004 Document Revised: 12/08/2011 Document Reviewed: 11/10/2008 °ExitCare® Patient Information ©2015 [employer]. This information is not intended to replace advice given to you by your health care provider. Make sure you discuss any questions you have with your health care provider. ° °

## 2014-09-25 NOTE — ED Notes (Signed)
Patient left without DC papers Patient left without DC VS

## 2014-09-26 LAB — CULTURE, GROUP A STREP

## 2014-09-29 ENCOUNTER — Emergency Department (HOSPITAL_COMMUNITY)
Admission: EM | Admit: 2014-09-29 | Discharge: 2014-09-29 | Disposition: A | Discharge status: Home / Self Care | Payer: Self-pay | Attending: Emergency Medicine | Admitting: Emergency Medicine

## 2014-09-29 ENCOUNTER — Encounter (HOSPITAL_COMMUNITY): Payer: Self-pay | Admitting: *Deleted

## 2014-09-29 DIAGNOSIS — Z79899 Other long term (current) drug therapy: Secondary | ICD-10-CM | POA: Insufficient documentation

## 2014-09-29 DIAGNOSIS — Z8639 Personal history of other endocrine, nutritional and metabolic disease: Secondary | ICD-10-CM | POA: Insufficient documentation

## 2014-09-29 DIAGNOSIS — K625 Hemorrhage of anus and rectum: Secondary | ICD-10-CM | POA: Insufficient documentation

## 2014-09-29 DIAGNOSIS — Z720 Tobacco use: Secondary | ICD-10-CM | POA: Insufficient documentation

## 2014-09-29 DIAGNOSIS — J45909 Unspecified asthma, uncomplicated: Secondary | ICD-10-CM | POA: Insufficient documentation

## 2014-09-29 MED ORDER — HYDROCORTISONE 2.5 % RE CREA
TOPICAL_CREAM | RECTAL | Status: DC
Start: 2014-09-29 — End: 2014-10-09

## 2014-09-29 NOTE — ED Provider Notes (Signed)
CSN: [id number]     Arrival date & time 09/29/14  0128 History   First MD Initiated Contact with Patient 09/29/14 0424     Chief Complaint  Patient presents with  . Hemorrhoids     (Consider location/radiation/quality/duration/timing/severity/associated sxs/prior Treatment) Patient is a 24 y.o. male presenting with hematochezia. The history is provided by the patient. No language interpreter was used.  Rectal Bleeding Quality:  Bright red Associated symptoms: no dizziness and no fever   Associated symptoms comment:  He presents for evaluation of intermittent rectal bleeding for weeks. Tonight he felt he had to have a bowel movement and reports passing only blood. He has mild abdominal discomfort. No nausea or vomiting. No fever. No syncope or near syncope.   Past Medical History  Diagnosis Date  . Asthma   . Dehydration    Past Surgical History  Procedure Laterality Date  . Appendectomy     No family history on file. History  Substance Use Topics  . Smoking status: Current Every Day Smoker  . Smokeless tobacco: Never Used  . Alcohol Use: Yes     Comment: occasionally    Review of Systems  Constitutional: Negative for fever and chills.  Respiratory: Negative.   Cardiovascular: Negative.   Gastrointestinal: Positive for blood in stool and hematochezia.  Musculoskeletal: Negative.   Skin: Negative.   Neurological: Negative for dizziness, syncope and weakness.      Allergies  Robitussin nighttime cough dm  Home Medications   Prior to Admission medications   Medication Sig Start Date End Date Taking? Authorizing Provider  acetaminophen (TYLENOL) 325 MG tablet Take 650 mg by mouth every 6 (six) hours as needed for mild pain.   Yes Historical Provider, MD  ibuprofen (ADVIL,MOTRIN) 200 MG tablet Take 400 mg by mouth every 6 (six) hours as needed for moderate pain.   Yes Historical Provider, MD  albuterol (PROVENTIL HFA;VENTOLIN HFA) 108 (90 BASE) MCG/ACT inhaler Inhale  1 puff into the lungs every 6 (six) hours as needed for wheezing or shortness of breath.    Historical Provider, MD   BP 126/64 mmHg  Pulse 84  Temp(Src) 97.8 F (36.6 C) (Oral)  Resp 18  Wt 145 lb (65.772 kg)  SpO2 99% Physical Exam  Constitutional: He is oriented to person, place, and time. He appears well-developed and well-nourished.  Neck: Normal range of motion.  Cardiovascular: Normal rate.   Pulmonary/Chest: Effort normal.  Abdominal: Soft. There is no tenderness. There is no rebound.  Genitourinary:  No blood at the rectum. No visualized hemorrhoids. Nontender.    Musculoskeletal: Normal range of motion.  Neurological: He is alert and oriented to person, place, and time.  Skin: Skin is warm and dry.  Psychiatric: He has a normal mood and affect.    ED Course  Procedures (including critical care time) Labs Review Labs Reviewed - No data to display  Imaging Review No results found.   EKG Interpretation None      MDM   Final diagnoses:  None    1. Rectal bleeding  Patient has no tachycardia, dizziness, abdominal tenderness. Hemorrhoids likely with longterm symptoms and normal presentation today. Will refer to GI.    Arnoldo Hooker, PA-C 10/03/14 2257  Vanetta Mulders, MD 10/04/14 0730

## 2014-09-29 NOTE — ED Notes (Signed)
PT states that he has hemorrhoids and has had bleeding off and on; pt states that tonight there was "a lot" of blood to the toilet and noted to his underwear; pt describes it as bright red blood; pt c/o mild abd discomfort

## 2014-09-29 NOTE — Discharge Instructions (Signed)
Bloody Stools  Bloody stools often mean that there is a problem in the digestive tract. Your caregiver may use the term "melena" to describe black, tarry, and bad smelling stools or "hematochezia" to describe red or maroon-colored stools. Blood seen in the stool can be caused by bleeding anywhere along the intestinal tract.   A black stool usually means that blood is coming from the upper part of the gastrointestinal tract (esophagus, stomach, or small bowel). Passing maroon-colored stools or bright red blood usually means that blood is coming from lower down in the large bowel or the rectum. However, sometimes massive bleeding in the stomach or small intestine can cause bright red bloody stools.   Consuming black licorice, lead, iron pills, medicines containing bismuth subsalicylate, or blueberries can also cause black stools. Your caregiver can test black stools to see if blood is present.  It is important that the cause of the bleeding be found. Treatment can then be started, and the problem can be corrected. Rectal bleeding may not be serious, but you should not assume everything is okay until you know the cause. It is very important to follow up with your caregiver or a specialist in gastrointestinal problems.  CAUSES   Blood in the stools can come from various underlying causes. Often, the cause is not found during your first visit. Testing is often needed to discover the cause of bleeding in the gastrointestinal tract. Causes range from simple to serious or even life-threatening. Possible causes include:  · Hemorrhoids. These are veins that are full of blood (engorged) in the rectum. They cause pain, inflammation, and may bleed.  · Anal fissures. These are areas of painful tearing which may bleed. They are often caused by passing hard stool.  · Diverticulosis. These are pouches that form on the colon over time, with age, and may bleed significantly.  · Diverticulitis. This is inflammation in areas with  diverticulosis. It can cause pain, fever, and bloody stools, although bleeding is rare.  · Proctitis and colitis. These are inflamed areas of the rectum or colon. They may cause pain, fever, and bloody stools.  · Polyps and cancer. Colon cancer is a leading cause of preventable cancer death. It often starts out as precancerous polyps that can be removed during a colonoscopy, preventing progression into cancer. Sometimes, polyps and cancer may cause rectal bleeding.  · Gastritis and ulcers. Bleeding from the upper gastrointestinal tract (near the stomach) may travel through the intestines and produce black, sometimes tarry, often bad smelling stools. In certain cases, if the bleeding is fast enough, the stools may not be black, but red and the condition may be life-threatening.  SYMPTOMS   You may have stools that are bright red and bloody, that are normal color with blood on them, or that are dark black and tarry. In some cases, you may only have blood in the toilet bowl. Any of these cases need medical care. You may also have:  · Pain at the anus or anywhere in the rectum.  · Lightheadedness or feeling faint.  · Extreme weakness.  · Nausea or vomiting.  · Fever.  DIAGNOSIS  Your caregiver may use the following methods to find the cause of your bleeding:  · Taking a medical history. Age is important. Older people tend to develop polyps and cancer more often. If there is anal pain and a hard, large stool associated with bleeding, a tear of the anus may be the cause. If blood drips into the toilet after a bowel movement, bleeding hemorrhoids may be the   problem. The color and frequency of the bleeding are additional considerations. In most cases, the medical history provides clues, but seldom the final answer.  · A visual and finger (digital) exam. Your caregiver will inspect the anal area, looking for tears and hemorrhoids. A finger exam can provide information when there is tenderness or a growth inside. In men, the  prostate is also examined.  · Endoscopy. Several types of small, long scopes (endoscopes) are used to view the colon.  ¨ In the office, your caregiver may use a rigid, or more commonly, a flexible viewing sigmoidoscope. This exam is called flexible sigmoidoscopy. It is performed in 5 to 10 minutes.  ¨ A more thorough exam is accomplished with a colonoscope. It allows your caregiver to view the entire 5 to 6 foot long colon. Medicine to help you relax (sedative) is usually given for this exam. Frequently, a bleeding lesion may be present beyond the reach of the sigmoidoscope. So, a colonoscopy may be the best exam to start with. Both exams are usually done on an outpatient basis. This means the patient does not stay overnight in the hospital or surgery center.  ¨ An upper endoscopy may be needed to examine your stomach. Sedation is used and a flexible endoscope is put in your mouth, down to your stomach.  · A barium enema X-ray. This is an X-ray exam. It uses liquid barium inserted by enema into the rectum. This test alone may not identify an actual bleeding point. X-rays highlight abnormal shadows, such as those made by lumps (tumors), diverticuli, or colitis.  TREATMENT   Treatment depends on the cause of your bleeding.   · For bleeding from the stomach or colon, the caregiver doing your endoscopy or colonoscopy may be able to stop the bleeding as part of the procedure.  · Inflammation or infection of the colon can be treated with medicines.  · Many rectal problems can be treated with creams, suppositories, or warm baths.  · Surgery is sometimes needed.  · Blood transfusions are sometimes needed if you have lost a lot of blood.  · For any bleeding problem, let your caregiver know if you take aspirin or other blood thinners regularly.  HOME CARE INSTRUCTIONS   · Take any medicines exactly as prescribed.  · Keep your stools soft by eating a diet high in fiber. Prunes (1 to 3 a day) work well for many people.  · Drink  enough water and fluids to keep your urine clear or pale yellow.  · Take sitz baths if advised. A sitz bath is when you sit in a bathtub with warm water for 10 to 15 minutes to soak, soothe, and cleanse the rectal area.  · If enemas or suppositories are advised, be sure you know how to use them. Tell your caregiver if you have problems with this.  · Monitor your bowel movements to look for signs of improvement or worsening.  SEEK MEDICAL CARE IF:   · You do not improve in the time expected.  · Your condition worsens after initial improvement.  · You develop any new symptoms.  SEEK IMMEDIATE MEDICAL CARE IF:   · You develop severe or prolonged rectal bleeding.  · You vomit blood.  · You feel weak or faint.  · You have a fever.  MAKE SURE YOU:  · Understand these instructions.  · Will watch your condition.  · Will get help right away if you are not doing well or get worse.    Document Released: 09/05/2002 Document Revised: 12/08/2011 Document Reviewed: 01/31/2011  [employer]® Patient Information ©2015 [employer]. This information is not intended to replace advice given to you by your health care provider. Make sure you discuss any questions you have with your health care provider.

## 2014-10-05 ENCOUNTER — Emergency Department (HOSPITAL_COMMUNITY)
Admission: EM | Admit: 2014-10-05 | Discharge: 2014-10-05 | Discharge status: Against Medical Advice | Payer: Self-pay | Attending: Emergency Medicine | Admitting: Emergency Medicine

## 2014-10-05 ENCOUNTER — Encounter: Payer: Self-pay | Admitting: Gastroenterology

## 2014-10-05 ENCOUNTER — Encounter (HOSPITAL_COMMUNITY): Payer: Self-pay | Admitting: Cardiology

## 2014-10-05 DIAGNOSIS — Z720 Tobacco use: Secondary | ICD-10-CM | POA: Insufficient documentation

## 2014-10-05 DIAGNOSIS — J45909 Unspecified asthma, uncomplicated: Secondary | ICD-10-CM | POA: Insufficient documentation

## 2014-10-05 DIAGNOSIS — K922 Gastrointestinal hemorrhage, unspecified: Secondary | ICD-10-CM | POA: Insufficient documentation

## 2014-10-05 LAB — CBC WITH DIFFERENTIAL/PLATELET
BASOS ABS: 0 10*3/uL (ref 0.0–0.1)
Basophils Relative: 0 % (ref 0–1)
EOS PCT: 0 % (ref 0–5)
Eosinophils Absolute: 0 10*3/uL (ref 0.0–0.7)
HEMATOCRIT: 44 % (ref 39.0–52.0)
HEMOGLOBIN: 14.8 g/dL (ref 13.0–17.0)
LYMPHS ABS: 1.9 10*3/uL (ref 0.7–4.0)
LYMPHS PCT: 37 % (ref 12–46)
MCH: 28.9 pg (ref 26.0–34.0)
MCHC: 33.6 g/dL (ref 30.0–36.0)
MCV: 85.9 fL (ref 78.0–100.0)
MONO ABS: 0.4 10*3/uL (ref 0.1–1.0)
MONOS PCT: 8 % (ref 3–12)
NEUTROS ABS: 2.7 10*3/uL (ref 1.7–7.7)
Neutrophils Relative %: 55 % (ref 43–77)
Platelets: 329 10*3/uL (ref 150–400)
RBC: 5.12 MIL/uL (ref 4.22–5.81)
RDW: 13.1 % (ref 11.5–15.5)
WBC: 5.1 10*3/uL (ref 4.0–10.5)

## 2014-10-05 LAB — COMPREHENSIVE METABOLIC PANEL
ALBUMIN: 4.9 g/dL (ref 3.5–5.2)
ALT: 16 U/L (ref 0–53)
AST: 27 U/L (ref 0–37)
Alkaline Phosphatase: 87 U/L (ref 39–117)
Anion gap: 9 (ref 5–15)
BILIRUBIN TOTAL: 0.7 mg/dL (ref 0.3–1.2)
BUN: 15 mg/dL (ref 6–23)
CO2: 27 mmol/L (ref 19–32)
Calcium: 9.9 mg/dL (ref 8.4–10.5)
Chloride: 102 mEq/L (ref 96–112)
Creatinine, Ser: 1.02 mg/dL (ref 0.50–1.35)
Glucose, Bld: 103 mg/dL — ABNORMAL HIGH (ref 70–99)
POTASSIUM: 4.1 mmol/L (ref 3.5–5.1)
Sodium: 138 mmol/L (ref 135–145)
TOTAL PROTEIN: 8.8 g/dL — AB (ref 6.0–8.3)

## 2014-10-05 NOTE — ED Notes (Signed)
Pt reports that he has been having left sided abd pain and noticed blood in his stool. States that feels like he has to have a bowel movement but is unable too.

## 2014-10-09 ENCOUNTER — Ambulatory Visit (INDEPENDENT_AMBULATORY_CARE_PROVIDER_SITE_OTHER): Payer: Self-pay | Admitting: Gastroenterology

## 2014-10-09 ENCOUNTER — Encounter: Payer: Self-pay | Admitting: Gastroenterology

## 2014-10-09 VITALS — BP 94/62 | HR 68 | Ht 68.0 in | Wt 128.4 lb

## 2014-10-09 DIAGNOSIS — K625 Hemorrhage of anus and rectum: Secondary | ICD-10-CM

## 2014-10-09 DIAGNOSIS — K5900 Constipation, unspecified: Secondary | ICD-10-CM

## 2014-10-09 NOTE — Progress Notes (Signed)
HPI: This is a    Very pleasant 24 year old man whom I am meeting for the first time today  Cbc, cmet last week were both essentially normal  Having trouble with constipation, chronic since very young.  Really has to push and strain (for a long time).  Bleeding recently, started 1-2 months ago.  Saw a lot of blood 1-2 weeks.  Has not really tried anything to help get his bowels moving  Will usually have BM about once per week.  Drinks no caffeine  Overall his weight has been going down, or at least not up.   Review of systems: Pertinent positive and negative review of systems were noted in the above HPI section. Complete review of systems was performed and was otherwise normal.    Past Medical History  Diagnosis Date  . Asthma   . Dehydration     Past Surgical History  Procedure Laterality Date  . Appendectomy      Current Outpatient Prescriptions  Medication Sig Dispense Refill  . acetaminophen (TYLENOL) 325 MG tablet Take 650 mg by mouth every 6 (six) hours as needed for mild pain.    Marland Kitchen. albuterol (PROVENTIL HFA;VENTOLIN HFA) 108 (90 BASE) MCG/ACT inhaler Inhale 1 puff into the lungs every 6 (six) hours as needed for wheezing or shortness of breath.     No current facility-administered medications for this visit.    Allergies as of 10/09/2014 - Review Complete 10/09/2014  Allergen Reaction Noted  . Robitussin nighttime cough dm [doxylamine-dm]  12/02/2013    Family History  Problem Relation Age of Onset  . Colon cancer Paternal Grandfather   . Esophageal cancer Neg Hx   . Liver disease Neg Hx   . Kidney disease Neg Hx   . Diabetes Mother   . Diabetes Maternal Grandmother   . Diabetes Paternal Grandmother   . Heart disease Neg Hx     History   Social History  . Marital Status: Single    Spouse Name: N/A    Number of Children: N/A  . Years of Education: N/A   Occupational History  . Not on file.   Social History Main Topics  . Smoking status:  Current Every Day Smoker  . Smokeless tobacco: Never Used     Comment: form given 10/09/14  . Alcohol Use: No  . Drug Use: Yes    Special: Marijuana  . Sexual Activity: Yes    Birth Control/ Protection: Condom   Other Topics Concern  . Not on file   Social History Narrative       Physical Exam: BP 94/62 mmHg  Pulse 68  Ht 5\' 8"  (1.727 m)  Wt 128 lb 6.4 oz (58.242 kg)  BMI 19.53 kg/m2 Constitutional: generally well-appearing Psychiatric: alert and oriented x3 Eyes: extraocular movements intact Mouth: oral pharynx moist, no lesions Neck: supple no lymphadenopathy Cardiovascular: heart regular rate and rhythm Lungs: clear to auscultation bilaterally Abdomen: soft, nontender, nondistended, no obvious ascites, no peritoneal signs, normal bowel sounds Extremities: no lower extremity edema bilaterally Skin: no lesions on visible extremities Rectal examination: no external anal hemorrhoids, no anal fissures, no clear rectal masses, stool is brown and not checked for Hemoccult.   Assessment and plan: 24 y.o. male with  Chronic constipation, intermittent rectal bleeding, chronic abdominal discomfort  I think his abdominal pains are related to his chronic constipation. Recent lab tests were reassuring including CBC and complete metabolic profile. He has never really tried anything for his chronic constipation and so  I'm going to start him on fiber supplements and I advised that he try to stay better hydrated which she admits is a problem for him. We'll proceed with flexible sigmoidoscopy at his soonest convenience.

## 2014-10-09 NOTE — Patient Instructions (Addendum)
You have been given a separate informational sheet regarding your tobacco use, the importance of quitting and local resources to help you quit.  You have been scheduled for a flexible sigmoidoscopy. Please follow the written instructions given to you at your visit today. If you use inhalers (even only as needed), please bring them with you on the day of your procedure.  Please start taking citrucel (orange flavored) powder fiber supplement.  This may cause some bloating at first but that usually goes away. Begin with a small spoonful and work your way up to a large, heaping spoonful daily over a week. Stay hydrated with water daily.

## 2014-10-14 ENCOUNTER — Emergency Department (HOSPITAL_COMMUNITY): Payer: Self-pay

## 2014-10-14 ENCOUNTER — Encounter (HOSPITAL_COMMUNITY): Payer: Self-pay

## 2014-10-14 ENCOUNTER — Emergency Department (HOSPITAL_COMMUNITY)
Admission: EM | Admit: 2014-10-14 | Discharge: 2014-10-14 | Disposition: A | Discharge status: Home / Self Care | Payer: Self-pay | Attending: Emergency Medicine | Admitting: Emergency Medicine

## 2014-10-14 DIAGNOSIS — J45909 Unspecified asthma, uncomplicated: Secondary | ICD-10-CM | POA: Insufficient documentation

## 2014-10-14 DIAGNOSIS — R079 Chest pain, unspecified: Secondary | ICD-10-CM

## 2014-10-14 DIAGNOSIS — Z8639 Personal history of other endocrine, nutritional and metabolic disease: Secondary | ICD-10-CM | POA: Insufficient documentation

## 2014-10-14 DIAGNOSIS — Z720 Tobacco use: Secondary | ICD-10-CM | POA: Insufficient documentation

## 2014-10-14 DIAGNOSIS — Z79899 Other long term (current) drug therapy: Secondary | ICD-10-CM | POA: Insufficient documentation

## 2014-10-14 DIAGNOSIS — J209 Acute bronchitis, unspecified: Secondary | ICD-10-CM | POA: Insufficient documentation

## 2014-10-14 LAB — BASIC METABOLIC PANEL
Anion gap: 9 (ref 5–15)
BUN: 19 mg/dL (ref 6–23)
CO2: 28 mmol/L (ref 19–32)
Calcium: 9.6 mg/dL (ref 8.4–10.5)
Chloride: 100 mEq/L (ref 96–112)
Creatinine, Ser: 1.08 mg/dL (ref 0.50–1.35)
GFR calc Af Amer: >90 mL/min (ref >90)
GFR calc non Af Amer: >90 mL/min (ref >90)
Glucose, Bld: 106 mg/dL — ABNORMAL HIGH (ref 70–99)
POTASSIUM: 3.7 mmol/L (ref 3.5–5.1)
SODIUM: 137 mmol/L (ref 135–145)

## 2014-10-14 LAB — CBC
HCT: 44.4 % (ref 39.0–52.0)
Hemoglobin: 15 g/dL (ref 13.0–17.0)
MCH: 28.8 pg (ref 26.0–34.0)
MCHC: 33.8 g/dL (ref 30.0–36.0)
MCV: 85.4 fL (ref 78.0–100.0)
PLATELETS: 326 10*3/uL (ref 150–400)
RBC: 5.2 MIL/uL (ref 4.22–5.81)
RDW: 12.9 % (ref 11.5–15.5)
WBC: 6.1 10*3/uL (ref 4.0–10.5)

## 2014-10-14 LAB — BRAIN NATRIURETIC PEPTIDE: B NATRIURETIC PEPTIDE 5: 12.4 pg/mL (ref 0.0–100.0)

## 2014-10-14 LAB — I-STAT TROPONIN, ED: TROPONIN I, POC: 0 ng/mL (ref 0.00–0.08)

## 2014-10-14 MED ORDER — ALBUTEROL SULFATE HFA 108 (90 BASE) MCG/ACT IN AERS
2.0000 | INHALATION_SPRAY | RESPIRATORY_TRACT | Status: DC | PRN
  Administered 2014-10-14: 2 via RESPIRATORY_TRACT
  Filled 2014-10-14: qty 6.7

## 2014-10-14 MED ORDER — HYDROCODONE-ACETAMINOPHEN 5-325 MG PO TABS: 1.0000 | ORAL_TABLET | Freq: Four times a day (QID) | ORAL | Status: DC | PRN

## 2014-10-14 MED ORDER — HYDROCODONE-ACETAMINOPHEN 5-325 MG PO TABS
1.0000 | ORAL_TABLET | Freq: Once | ORAL | Status: AC
  Administered 2014-10-14: 1 via ORAL
  Filled 2014-10-14: qty 1

## 2014-10-14 NOTE — ED Notes (Signed)
He c/o left-sided chest pain x 1 1/2 weeks with occasional palpitations.  He is very healthy and athletic-looking and states he normally plays much basketball.  He states that since he has had this chest discomfort he is unable to play any sports.  His skin is normal, warm and dry and he is breathing normally.

## 2014-10-14 NOTE — ED Notes (Addendum)
Patient states for one week he had what he thought was gas. He took something over the counter for gas but last night the pain was severe that he could not sleep. He now has pain in chest that now shoots down his left arm. He also has numbness in the same arm. Patient admits to smoking marijuana but no other drugs. Shortness of breath with these episodes.

## 2014-10-14 NOTE — ED Provider Notes (Signed)
CSN: [id number]     Arrival date & time 10/14/14  1731 History   First MD Initiated Contact with Patient 10/14/14 1850     Chief Complaint  Patient presents with  . Chest Pain     (Consider location/radiation/quality/duration/timing/severity/associated sxs/prior Treatment) HPI Complains of left-sided chest pain onset 1.5 weeks ago, constant, nonexertional. Nonradiating he denies the pain radiates down his left arm. Pain is worse with deep breaths or with coughing he admits to nonproductive cough. He denies shortness of breath. Denies fever Pain is pleuritic in quality worse with deep inspiration. No treatment prior to coming here. Other associated symptoms include occasional palpitations, none presently. Past Medical History  Diagnosis Date  . Asthma   . Dehydration    Past Surgical History  Procedure Laterality Date  . Appendectomy     Family History  Problem Relation Age of Onset  . Colon cancer Paternal Grandfather   . Esophageal cancer Neg Hx   . Liver disease Neg Hx   . Kidney disease Neg Hx   . Diabetes Mother   . Diabetes Maternal Grandmother   . Diabetes Paternal Grandmother   . Heart disease Neg Hx    History  Substance Use Topics  . Smoking status: Current Every Day Smoker  . Smokeless tobacco: Never Used     Comment: form given 10/09/14  . Alcohol Use: No    denies tobacco and admits to marijuana denies alcohol denies other illicit drug use  Review of Systems  Constitutional: Negative.   HENT: Negative.   Respiratory: Positive for cough.   Cardiovascular: Positive for chest pain.  Gastrointestinal: Negative.   Musculoskeletal: Negative.   Skin: Negative.   Neurological: Negative.   Psychiatric/Behavioral: Negative.   All other systems reviewed and are negative.     Allergies  Robitussin nighttime cough dm  Home Medications   Prior to Admission medications   Medication Sig Start Date End Date Taking? Authorizing Provider  acetaminophen (TYLENOL)  325 MG tablet Take 650 mg by mouth every 6 (six) hours as needed for mild pain.   Yes Historical Provider, MD  albuterol (PROVENTIL HFA;VENTOLIN HFA) 108 (90 BASE) MCG/ACT inhaler Inhale 1 puff into the lungs every 6 (six) hours as needed for wheezing or shortness of breath.   Yes Historical Provider, MD   BP 128/78 mmHg  Pulse 90  Temp(Src) 97.9 F (36.6 C) (Oral)  Resp 14  SpO2 96% Physical Exam  Constitutional: He appears well-developed and well-nourished.  HENT:  Head: Normocephalic and atraumatic.  Eyes: Conjunctivae are normal. Pupils are equal, round, and reactive to light.  Neck: Neck supple. No tracheal deviation present. No thyromegaly present.  Cardiovascular: Normal rate and regular rhythm.   No murmur heard. Pulmonary/Chest: Effort normal and breath sounds normal. He exhibits no tenderness.  Chest Pain is easily reproducible with forcible abduction of left shoulder. Left chest wall is tender  Abdominal: Soft. Bowel sounds are normal. He exhibits no distension. There is no tenderness.  Musculoskeletal: Normal range of motion. He exhibits no edema or tenderness.  Neurological: He is alert. Coordination normal.  Skin: Skin is warm and dry. No rash noted.  Psychiatric: He has a normal mood and affect.  Nursing note and vitals reviewed.   ED Course  Procedures (including critical care time) Labs Review Labs Reviewed  CBC  BASIC METABOLIC PANEL  BRAIN NATRIURETIC PEPTIDE  I-STAT TROPOININ, ED    Imaging Review No results found.   EKG Interpretation   Date/Time:  Saturday  October 14 2014 17:41:58 EST Ventricular Rate:  87 PR Interval:  146 QRS Duration: 80 QT Interval:  283 QTC Calculation: 340 R Axis:   67 Text Interpretation:  Sinus rhythm Biatrial enlargement Borderline T wave  abnormalities No old tracing to compare Confirmed by Ethelda Chick  MD, Nil Bolser  [phone]) on 10/14/2014 6:07:39 PM     8:55 PM pain improved after treatment with Norco. Chest x-ray  viewed by me Results for orders placed or performed during the hospital encounter of 10/14/14  CBC  Result Value Ref Range   WBC 6.1 4.0 - 10.5 K/uL   RBC 5.20 4.22 - 5.81 MIL/uL   Hemoglobin 15.0 13.0 - 17.0 g/dL   HCT 98.1 19.1 - 47.8 %   MCV 85.4 78.0 - 100.0 fL   MCH 28.8 26.0 - 34.0 pg   MCHC 33.8 30.0 - 36.0 g/dL   RDW 29.5 62.1 - 30.8 %   Platelets 326 150 - 400 K/uL  Basic metabolic panel  Result Value Ref Range   Sodium 137 135 - 145 mmol/L   Potassium 3.7 3.5 - 5.1 mmol/L   Chloride 100 96 - 112 mEq/L   CO2 28 19 - 32 mmol/L   Glucose, Bld 106 (H) 70 - 99 mg/dL   BUN 19 6 - 23 mg/dL   Creatinine, Ser 6.57 0.50 - 1.35 mg/dL   Calcium 9.6 8.4 - 84.6 mg/dL   GFR calc non Af Amer >90 >90 mL/min   GFR calc Af Amer >90 >90 mL/min   Anion gap 9 5 - 15  BNP (order ONLY if patient complains of dyspnea/SOB AND you have documented it for THIS visit)  Result Value Ref Range   B Natriuretic Peptide 12.4 0.0 - 100.0 pg/mL  I-stat troponin, ED (not at [facility])  Result Value Ref Range   Troponin i, poc 0.00 0.00 - 0.08 ng/mL   Comment 3           Dg Chest 2 View  10/14/2014   CLINICAL DATA:  Chest pain, feeling like he's gasping, cough and congestion for 1 week, asthma, smoker  EXAM: CHEST  2 VIEW  COMPARISON:  None  FINDINGS: Normal heart size, mediastinal contours and pulmonary vascularity.  Minimal peribronchial thickening and pulmonary hyperinflation.  No infiltrate, pleural effusion or pneumothorax.  No acute osseous findings.  IMPRESSION: Minimal peribronchial thickening and hyperinflation likely related to asthma.  No acute infiltrate.   Electronically Signed   By: Ulyses Southward M.D.   On: 10/14/2014 19:51    MDM  Pain is musculoskeletal in etiology. Patient has run out of his inhaler. I counseled him for 5 minutes on smoking cessation He'll go home with albuterol inhaler he has a spacer at home to use 2 puffs every 4 hours when necessary cough or shortness of breath. Prescription  Norco. Resource referral guide Diagnosis #1 chest wall pain #2 acute bronchitis Final diagnoses:  None        Doug Sou, MD 10/14/14 2102

## 2014-10-14 NOTE — Discharge Instructions (Signed)
Acute Bronchitis Stop using marijuana as it causes heart damage and lung damage. Use your inhaler 2 puffs every 4 hours as needed for cough or shortness of breath. Take Tylenol for mild pain or the pain medicine prescribed for bad pain. Call any of the numbers on the resource guide to get a primary care physician Bronchitis is when the airways that extend from the windpipe into the lungs get red, puffy, and painful (inflamed). Bronchitis often causes thick spit (mucus) to develop. This leads to a cough. A cough is the most common symptom of bronchitis. In acute bronchitis, the condition usually begins suddenly and goes away over time (usually in 2 weeks). Smoking, allergies, and asthma can make bronchitis worse. Repeated episodes of bronchitis may cause more lung problems. HOME CARE  Rest.  Drink enough fluids to keep your pee (urine) clear or pale yellow (unless you need to limit fluids as told by your doctor).  Only take over-the-counter or prescription medicines as told by your doctor.  Avoid smoking and secondhand smoke. These can make bronchitis worse. If you are a smoker, think about using nicotine gum or skin patches. Quitting smoking will help your lungs heal faster.  Reduce the chance of getting bronchitis again by:  Washing your hands often.  Avoiding people with cold symptoms.  Trying not to touch your hands to your mouth, nose, or eyes.  Follow up with your doctor as told. GET HELP IF: Your symptoms do not improve after 1 week of treatment. Symptoms include:  Cough.  Fever.  Coughing up thick spit.  Body aches.  Chest congestion.  Chills.  Shortness of breath.  Sore throat. GET HELP RIGHT AWAY IF:   You have an increased fever.  You have chills.  You have severe shortness of breath.  You have bloody thick spit (sputum).  You throw up (vomit) often.  You lose too much body fluid (dehydration).  You have a severe headache.  You faint. MAKE SURE YOU:    Understand these instructions.  Will watch your condition.  Will get help right away if you are not doing well or get worse. Document Released: 03/03/2008 Document Revised: 05/18/2013 Document Reviewed: 03/08/2013 [facility] Patient Information 2015 [place], [place]. This information is not intended to replace advice given to you by your health care provider. Make sure you discuss any questions you have with your health care provider.  Emergency Department Resource Guide 1) Find a Doctor and Pay Out of Pocket Although you won't have to find out who is covered by your insurance plan, it is a good idea to ask around and get recommendations. You will then need to call the office and see if the doctor you have chosen will accept you as a new patient and what types of options they offer for patients who are self-pay. Some doctors offer discounts or will set up payment plans for their patients who do not have insurance, but you will need to ask so you aren't surprised when you get to your appointment.  2) Contact Your Local Health Department Not all health departments have doctors that can see patients for sick visits, but many do, so it is worth a call to see if yours does. If you don't know where your local health department is, you can check in your phone book. The CDC also has a tool to help you locate your state's health department, and many state websites also have listings of all of their local health departments.  3) Find  a Walk-in Clinic If your illness is not likely to be very severe or complicated, you may want to try a walk in clinic. These are popping up all over the country in pharmacies, drugstores, and shopping centers. They're usually staffed by nurse practitioners or physician assistants that have been trained to treat common illnesses and complaints. They're usually fairly quick and inexpensive. However, if you have serious medical issues or chronic medical problems, these are probably  not your best option.  No Primary Care Doctor: - Call Health Connect at  [phone] - they can help you locate a primary care doctor that  accepts your insurance, provides certain services, etc. - Physician Referral Service- [phone]  Chronic Pain Problems: Organization         Address  Phone   Notes  [facility]  [phone] Patients need to be referred by their primary care doctor.   Medication Assistance: Organization         Address  Phone   Notes  [facility] Medication [facility] [address] [place], [place] [address] [phone] --Must be a resident of [facility] [place] -- Must have NO insurance coverage whatsoever (no Medicaid/ Medicare, etc.) -- The pt. MUST have a primary care doctor that directs their care regularly and follows them in the community   MedAssist  [phone]   [employer]  [phone]    Agencies that provide inexpensive medical care: Organization         Address  Phone   Notes  [facility]  [phone]   [facility]    [phone]) [phone]   [facility] [address] [place], [place] [address] [phone]   [facility] [address], [place] [phone]   Planned Parenthood    [phone]   [facility]    [phone]   [facility]  [address], [place] Phone:  [phone]) [phone], Fax:  [phone]) [phone] Hours of Operation:  9 am - 6 pm, M-F.  Also accepts Medicaid/Medicare and self-pay.  [facility]  [address], [place] Phone: [phone], Fax: [phone]. Hours of Operation:  8:30 am - 5:30 pm, M-F.  Also accepts Medicaid and self-pay.  [facility] [place] [address], [place] Phone: [phone]) [phone]   [facility] [address], [place], [place] [phone], Ext. [phone] Mondays & Thursdays: 7-9 AM.   First 15 patients are seen on a first come, first serve basis.    Medicaid-accepting [facility] Providers:  Organization         Address  Phone   Notes  [facility] [address], [place] [phone] Also accepts self-pay patients.  [facility] [address] [place], [place]  [phone]   [facility] [address], [place] [phone]) [phone]   [facility] [address] [place] [address], [place] [phone]) [phone]   Renaye RakersVeita Bland [address], [place]   [phone] Only accepts [place] Access [place] patients after they have their name applied to their card.   Self-Pay (no insurance) in [facility] [place]:  Organization         Address  Phone   Notes  Sickle Cell Patients, [facility] [address]  [place], [place] [phone]   [facility] [place] [phone]   [facility] [place]  [place], [place], [place] [phone]   [facility]/Dr. Osei-Bonsu  [address] [place] or [address], [place] [phone] Phone number for both [place] and [place] locations is the same.  [facility] and [facility] [address], [place] [phone]   [facility] [address] [place] or [address] [phone]   [facility] [address], [place] [phone], phone; [phone], fax Sees patients 1st and 3rd Saturday of every month.  Must not qualify for public or private insurance (i.e. Medicaid, Medicare, [place] Health Choice, Veterans' Benefits)  Household income should be no more than 200% of the poverty level The clinic cannot treat you if you are pregnant or think you are pregnant  Sexually transmitted diseases are not treated at the clinic.    Dental  Care: Organization         Address  Phone  Notes  [facility] Department of [facility] [place] [phone] Accepts children up to age 48 who are enrolled in [place] or [place]; pregnant women with a Medicaid card; and children who have applied for Medicaid or [place] Health Choice, but were declined, whose parents can pay a reduced fee at time of service.  [facility] Department of [facility]  [address], [place] [phone] Accepts children up to age 89 who are enrolled in [place] or [place]; pregnant women with a Medicaid card; and children who have applied for Medicaid or [place] Health Choice, but were declined, whose parents can pay a reduced fee at time of service.  [facility] Access PROGRAM  [place] [phone] Patients are seen by appointment only. Walk-ins are not accepted. [place] will see patients 74 years of age and older. Monday - Tuesday (8am-5pm) Most Wednesdays (8:30-5pm) $30 per visit, cash only  [facility] Adult [facility] Access PROGRAM  [address], [facility] [phone] Patients are seen by appointment only. Walk-ins are not accepted. [place] will see patients 21 years of age and older. One Wednesday Evening (Monthly: Volunteer Based).  $30 per visit, cash only  [place]  [phone] for adults; Children under age 59, call Graduate Pediatric Dentistry at [phone]. Children aged 49-14, please call [phone] to request a pediatric application.  Dental services are provided in all areas of dental care including fillings, crowns and bridges, complete and partial dentures, implants, gum treatment, root canals, and extractions. Preventive care is also provided. Treatment is provided to both adults and children. Patients are selected via a lottery and there is often a waiting list.   [facility] [address], [place]  [phone] [email or web address]   [facility] [address] [place], [place] [phone], Ext. [phone] Second and Fourth Thursday of each month, opens at 6:30 AM; Clinic ends at 9 AM.  Patients are seen on a first-come first-served basis, and a limited number are seen during each clinic.   [facility]  [address]ard Danker [place], [place] [phone]   Eligibility Requirements You must have lived in [place], [place], or  [place] counties for at least the last three months.   You cannot be eligible for state or federal sponsored [place], including [employer], [place], or [employer].   You generally cannot be eligible for healthcare insurance through your employer.    How to apply: Eligibility screenings are held every Tuesday and Wednesday afternoon from 1:00 pm until 4:00 pm. You do not need an appointment for the interview!  [facility] [address], [place], [place] [phone]   [facility] Health Department  [phone]   [facility] Health Department  [phone]   [facility]  [phone]    Behavioral Health Resources in the Community: Intensive Outpatient Programs Organization         Address  Phone  Notes  [facility] Services [address] [place], [place] [phone]   [facility] [address] [place], [place] [phone]   [facility]: [facility] S[facility] [address], [place], [place]  [phone]   [facility] Mental Health [address]  [place], [place] [phone] or [phone]   Substance Abuse Resources Organization         Address  Phone  Notes  Alcohol and Drug Services  [phone]   [facility]  [phone]   [facility]  [phone]   Floydene Flock  [phone]   Residential & Outpatient Substance Abuse Program  [phone]     Psychological Services Organization         Address  Phone  Notes  [facility]  [phone]   [facility] Services  [phone]   [facility] [address] [place] [phone] or [phone]    Mobile Crisis Teams Organization         Address  Phone  Notes  [facility], Mobile Crisis Care Unit  [phone]   [facility]  [address] [place], [place] [phone]   Doristine Locks [address] [place] [phone]    Self-Help/Support Groups Organization         Address  Phone             Notes  [facility] [place] - variety of support groups  [phone]- I7437963 Call for more information  Narcotics Anonymous (NA), Caring [facility] [address], [employer] [place]  2 meetings at this location   Statistician         Address  Phone  Notes  [facility] [address],    [place]  [phone]   [facility]  [address], [place] [address], [place], [place] [phone]   [facility] [address] [place], [place] [phone] Admissions: 8am-3pm M-F  [facility] [address]    [place], [place] [phone]   [facility] [address] [place], [place], [place] [phone]   [facility] [address],  [place], [place] [phone]   [facility] - Intensive Outpatient [address], [place], [place] [phone]   [facility] ([facility]iction [facility] [address] [place].,  [place], [place] [phone] or [phone]   [facility] Services ([facility]) [address] [place], [place] [phone] Accepts Medicaid  [facility] [address]  [place] [phone] Substance Abuse/Addiction Treatment   [facility] Resources Organization         Address  Phone  Notes  [facility]  [phone]) [phone]   Angie FavaJulie Brannon, PhD [address], [address] [place], [place]   [phone] or [phone]) [phone]   [facility]   [address] [place], [place] [phone]) [phone]   [facility] [address], [place], [place] [phone]) [phone] Insurance/Medicaid/sponsorship through [facility]  [facility] Families [address]                                    [place], [place] [phone]) [phone] Therapy/tele-psych/case  [facility] [address].   [place], [place] [phone]) [phone]    Dr. Lolly MustacheArfeen  [phone]) [phone]   [facility] [place]  [employer] [facility] 1) [address], [place] 2) [address], [place] 3)  [address], [place] [phone]) [phone]  [phone](336) [phone]   [facility] Child Abuse Hotline [phone]) [phone] or [phone](336) [phone] (After Hours)

## 2014-11-24 ENCOUNTER — Other Ambulatory Visit: Payer: Self-pay | Admitting: Gastroenterology

## 2014-11-24 ENCOUNTER — Telehealth: Payer: Self-pay | Admitting: Gastroenterology

## 2014-11-24 NOTE — Telephone Encounter (Signed)
Yes, please charge the no show fee

## 2014-11-27 ENCOUNTER — Telehealth: Payer: Self-pay | Admitting: *Deleted

## 2014-11-27 NOTE — Telephone Encounter (Signed)
  Follow up Call-  No flowsheet data found.   Patient questions: Follow up Call-  No flowsheet data found.   Patient questions:  Do you have a fever, pain , or abdominal swelling? No. Pain Score  0 *  Have you tolerated food without any problems? Yes.    Have you been able to return to your normal activities? Yes.    Do you have any questions about your discharge instructions: Diet   No. Medications  No. Follow up visit  No.  Do you have questions or concerns about your Care? No.  Actions: * If pain score is 4 or above: No action needed, pain <4.

## 2016-10-05 ENCOUNTER — Emergency Department (HOSPITAL_COMMUNITY)
Admission: EM | Admit: 2016-10-05 | Discharge: 2016-10-05 | Disposition: A | Discharge status: Home / Self Care | Payer: Self-pay | Attending: Emergency Medicine | Admitting: Emergency Medicine

## 2016-10-05 ENCOUNTER — Encounter (HOSPITAL_COMMUNITY): Payer: Self-pay

## 2016-10-05 DIAGNOSIS — A084 Viral intestinal infection, unspecified: Secondary | ICD-10-CM | POA: Insufficient documentation

## 2016-10-05 DIAGNOSIS — F17200 Nicotine dependence, unspecified, uncomplicated: Secondary | ICD-10-CM | POA: Insufficient documentation

## 2016-10-05 DIAGNOSIS — J45909 Unspecified asthma, uncomplicated: Secondary | ICD-10-CM | POA: Insufficient documentation

## 2016-10-05 LAB — COMPREHENSIVE METABOLIC PANEL
ALT: 30 U/L (ref 17–63)
AST: 35 U/L (ref 15–41)
Albumin: 5.1 g/dL — ABNORMAL HIGH (ref 3.5–5.0)
Alkaline Phosphatase: 66 U/L (ref 38–126)
Anion gap: 15 (ref 5–15)
BUN: 12 mg/dL (ref 6–20)
CHLORIDE: 102 mmol/L (ref 101–111)
CO2: 23 mmol/L (ref 22–32)
Calcium: 10 mg/dL (ref 8.9–10.3)
Creatinine, Ser: 1.08 mg/dL (ref 0.61–1.24)
Glucose, Bld: 103 mg/dL — ABNORMAL HIGH (ref 65–99)
Potassium: 3.7 mmol/L (ref 3.5–5.1)
Sodium: 140 mmol/L (ref 135–145)
Total Bilirubin: 1 mg/dL (ref 0.3–1.2)
Total Protein: 8.5 g/dL — ABNORMAL HIGH (ref 6.5–8.1)

## 2016-10-05 LAB — URINALYSIS, ROUTINE W REFLEX MICROSCOPIC
BACTERIA UA: NONE SEEN
BILIRUBIN URINE: NEGATIVE
Glucose, UA: NEGATIVE mg/dL
Hgb urine dipstick: NEGATIVE
KETONES UR: 20 mg/dL — AB
LEUKOCYTES UA: NEGATIVE
Nitrite: NEGATIVE
Protein, ur: 30 mg/dL — AB
SQUAMOUS EPITHELIAL / LPF: NONE SEEN
Specific Gravity, Urine: 1.024 (ref 1.005–1.030)
pH: 9 — ABNORMAL HIGH (ref 5.0–8.0)

## 2016-10-05 LAB — CBC
HEMATOCRIT: 44.2 % (ref 39.0–52.0)
Hemoglobin: 15.3 g/dL (ref 13.0–17.0)
MCH: 29.4 pg (ref 26.0–34.0)
MCHC: 34.6 g/dL (ref 30.0–36.0)
MCV: 85 fL (ref 78.0–100.0)
Platelets: 268 10*3/uL (ref 150–400)
RBC: 5.2 MIL/uL (ref 4.22–5.81)
RDW: 13.3 % (ref 11.5–15.5)
WBC: 11.1 10*3/uL — AB (ref 4.0–10.5)

## 2016-10-05 LAB — LIPASE, BLOOD: LIPASE: 21 U/L (ref 11–51)

## 2016-10-05 MED ORDER — ONDANSETRON 4 MG PO TBDP
ORAL_TABLET | ORAL | Status: AC
  Filled 2016-10-05: qty 1

## 2016-10-05 MED ORDER — METOCLOPRAMIDE HCL 5 MG/ML IJ SOLN
10.0000 mg | Freq: Once | INTRAMUSCULAR | Status: AC
  Administered 2016-10-05: 10 mg via INTRAVENOUS
  Filled 2016-10-05: qty 2

## 2016-10-05 MED ORDER — MORPHINE SULFATE (PF) 4 MG/ML IV SOLN
4.0000 mg | Freq: Once | INTRAVENOUS | Status: AC
  Administered 2016-10-05: 4 mg via INTRAVENOUS
  Filled 2016-10-05: qty 1

## 2016-10-05 MED ORDER — SODIUM CHLORIDE 0.9 % IV BOLUS (SEPSIS)
2000.0000 mL | Freq: Once | INTRAVENOUS | Status: AC
Start: 2016-10-05 — End: 2016-10-05
  Administered 2016-10-05: 2000 mL via INTRAVENOUS

## 2016-10-05 MED ORDER — ONDANSETRON HCL 4 MG PO TABS: 4.0000 mg | ORAL_TABLET | Freq: Four times a day (QID) | ORAL | 0 refills | Status: DC

## 2016-10-05 MED ORDER — ONDANSETRON 4 MG PO TBDP
4.0000 mg | ORAL_TABLET | Freq: Once | ORAL | Status: AC | PRN
  Administered 2016-10-05: 4 mg via ORAL

## 2016-10-05 NOTE — ED Provider Notes (Signed)
[facility]-EMERGENCY DEPT Provider Note   CSN: [id number] Arrival date & time: 10/05/16  1657  By signing my name below, I, Javier Docker, attest that this documentation has been prepared under the direction and in the presence of Audry Pili, MD. Electronically Signed: Javier Docker, ER Scribe. 05/10/2016. 7:25 PM.  History   Chief Complaint Chief Complaint  Patient presents with  . Abdominal Pain  . Emesis   The history is provided by the patient. No language interpreter was used.  Emesis   Associated symptoms include abdominal pain, chills and diarrhea.    HPI Comments: Jesus Lewis is a 26 y.o. male who presents to the Emergency Department complaining of abdominal pain, vomiting and diarrhea since waking this morning. He ate ihop last night around 4AM. He endorses associated mild CP with dry heaving. No SOB. No fevers. No dysuria. Rates pain 9/10 and intermittent cramping sensation that is epigastric in origin. He has a past surgical hx of appendectomy. He has a PMHx of hemorrhoids. No other symptoms noted.     Past Medical History:  Diagnosis Date  . Asthma   . Dehydration     There are no active problems to display for this patient.   Past Surgical History:  Procedure Laterality Date  . APPENDECTOMY         Home Medications    Prior to Admission medications   Medication Sig Start Date End Date Taking? Authorizing Provider  acetaminophen (TYLENOL) 325 MG tablet Take 650 mg by mouth every 6 (six) hours as needed for mild pain.    Historical Provider, MD  albuterol (PROVENTIL HFA;VENTOLIN HFA) 108 (90 BASE) MCG/ACT inhaler Inhale 1 puff into the lungs every 6 (six) hours as needed for wheezing or shortness of breath.    Historical Provider, MD  HYDROcodone-acetaminophen (NORCO) 5-325 MG per tablet Take 1 tablet by mouth every 6 (six) hours as needed for moderate pain or severe pain. 10/14/14   Doug Sou, MD    Family History Family History  Problem Relation  Age of Onset  . Colon cancer Paternal Grandfather   . Diabetes Mother   . Diabetes Maternal Grandmother   . Diabetes Paternal Grandmother   . Esophageal cancer Neg Hx   . Liver disease Neg Hx   . Kidney disease Neg Hx   . Heart disease Neg Hx     Social History Social History  Substance Use Topics  . Smoking status: Current Every Day Smoker  . Smokeless tobacco: Never Used     Comment: form given 10/09/14  . Alcohol use No     Allergies   Robitussin nighttime cough dm [doxylamine-dm]   Review of Systems Review of Systems  Constitutional: Positive for chills.  Gastrointestinal: Positive for abdominal pain, diarrhea and vomiting.   A complete 10 system review of systems was obtained and all systems are negative except as noted in the HPI and PMH.   Physical Exam Updated Vital Signs BP 134/65 (BP Location: Right Arm)   Pulse 105   Temp 98.7 F (37.1 C) (Oral)   Resp 17   SpO2 100%   Physical Exam  Constitutional: He is oriented to person, place, and time. Vital signs are normal. He appears well-developed and well-nourished. No distress.  HENT:  Head: Normocephalic and atraumatic.  Right Ear: Hearing normal.  Left Ear: Hearing normal.  Eyes: Conjunctivae and EOM are normal. Pupils are equal, round, and reactive to light.  Neck: Normal range of motion. Neck supple.  Cardiovascular: Normal rate, regular rhythm, normal heart sounds and intact distal pulses.   Pulmonary/Chest: Effort normal and breath sounds normal. No respiratory distress.  Abdominal: Soft. Normal appearance and bowel sounds are normal. There is tenderness in the epigastric area and left upper quadrant. There is no rigidity, no rebound, no guarding, no CVA tenderness, no tenderness at McBurney's point and negative Murphy's sign.  Musculoskeletal: Normal range of motion.  Neurological: He is alert and oriented to person, place, and time. Coordination normal.  Skin: Skin is warm and dry. He is not  diaphoretic.  Psychiatric: He has a normal mood and affect. His speech is normal and behavior is normal. Thought content normal.  Nursing note and vitals reviewed.  ED Treatments / Results  DIAGNOSTIC STUDIES: Oxygen Saturation is 100% on RA,   COORDINATION OF CARE: 7:26 PM Discussed treatment plan with pt at bedside and pt agreed to plan.  Labs (all labs ordered are listed, but only abnormal results are displayed) Labs Reviewed  COMPREHENSIVE METABOLIC PANEL - Abnormal; Notable for the following:       Result Value   Glucose, Bld 103 (*)    Total Protein 8.5 (*)    Albumin 5.1 (*)    All other components within normal limits  CBC - Abnormal; Notable for the following:    WBC 11.1 (*)    All other components within normal limits  URINALYSIS, ROUTINE W REFLEX MICROSCOPIC - Abnormal; Notable for the following:    pH 9.0 (*)    Ketones, ur 20 (*)    Protein, ur 30 (*)    All other components within normal limits  LIPASE, BLOOD    EKG  EKG Interpretation None       Radiology No results found.  Procedures Procedures (including critical care time)  Medications Ordered in ED Medications  ondansetron (ZOFRAN-ODT) disintegrating tablet 4 mg (4 mg Oral Given 10/05/16 1731)     Initial Impression / Assessment and Plan / ED Course  I have reviewed the triage vital signs and the nursing notes.  Pertinent labs & imaging results that were available during my care of the patient were reviewed by me and considered in my medical decision making (see chart for details).  Clinical Course    Final Clinical Impressions(s) / ED Diagnoses  {I have reviewed and evaluated the relevant laboratory values.   {I have reviewed the relevant previous healthcare records.  {I obtained HPI from historian. {Patient discussed with supervising physician.  ED Course:  Assessment: Patient is a 26yM presents with abdominal pain this morning s/p eating IHOP at 0400. N/V/D. On exam, nontoxic,  nonseptic appearing, in no apparent distress. Patient's pain and other symptoms adequately managed in emergency department.  Fluid bolus given.  Labs and vitals reviewed. Improved with antiemetics and fluids. Patient does not meet the SIRS or Sepsis criteria.  On repeat exam patient does not have a surgical abdomen and there are no peritoneal signs.  No indication of appendicitis, bowel obstruction, bowel perforation, cholecystitis, diverticulitis. Likely viral gastroenteritis vs food poisoning. Patient discharged home with symptomatic treatment and given strict instructions for follow-up with their primary care physician.  I have also discussed reasons to return immediately to the ER.  Patient expresses understanding and agrees with plan.  Disposition/Plan:  DC Home Additional Verbal discharge instructions given and discussed with patient.  Pt Instructed to f/u with PCP in the next week for evaluation and treatment of symptoms. Return precautions given Pt acknowledges and agrees with  plan  Supervising Physician Loren Racer, MD  Final diagnoses:  Viral gastroenteritis    New Prescriptions New Prescriptions   No medications on file   I personally performed the services described in this documentation, which was scribed in my presence. The recorded information has been reviewed and is accurate.       Audry Pili, PA-C 10/05/16 2047    Loren Racer, MD 10/08/16 [phone]

## 2016-10-05 NOTE — ED Notes (Signed)
Pt updated on room status

## 2016-10-05 NOTE — Discharge Instructions (Signed)
Please read and follow all provided instructions.  Your diagnoses today include:  1. Viral gastroenteritis     Tests performed today include: Blood counts and electrolytes Blood tests to check liver and kidney function Blood tests to check pancreas function Urine test to look for infection and pregnancy (in women) Vital signs. See below for your results today.   Medications prescribed:   Take any prescribed medications only as directed.  Home care instructions:  Follow any educational materials contained in this packet.  Follow-up instructions: Please follow-up with your primary care provider in the next 2 days for further evaluation of your symptoms.    Return instructions:  SEEK IMMEDIATE MEDICAL ATTENTION IF: The pain does not go away or becomes severe  A temperature above 101F develops  Repeated vomiting occurs (multiple episodes)  The pain becomes localized to portions of the abdomen. The right side could possibly be appendicitis. In an adult, the left lower portion of the abdomen could be colitis or diverticulitis.  Blood is being passed in stools or vomit (bright red or black tarry stools)  You develop chest pain, difficulty breathing, dizziness or fainting, or become confused, poorly responsive, or inconsolable (young children) If you have any other emergent concerns regarding your health  Additional Information: Abdominal (belly) pain can be caused by many things. Your caregiver performed an examination and possibly ordered blood/urine tests and imaging (CT scan, x-rays, ultrasound). Many cases can be observed and treated at home after initial evaluation in the emergency department. Even though you are being discharged home, abdominal pain can be unpredictable. Therefore, you need a repeated exam if your pain does not resolve, returns, or worsens. Most patients with abdominal pain don't have to be admitted to the hospital or have surgery, but serious problems like  appendicitis and gallbladder attacks can start out as nonspecific pain. Many abdominal conditions cannot be diagnosed in one visit, so follow-up evaluations are very important.  Your vital signs today were: BP 134/65 (BP Location: Right Arm)    Pulse 105    Temp 98.7 F (37.1 C) (Oral)    Resp 17    SpO2 100%  If your blood pressure (bp) was elevated above 135/85 this visit, please have this repeated by your doctor within one month. --------------

## 2016-10-05 NOTE — ED Triage Notes (Signed)
Patient states he awoke today with abdominal cramping and has had multiple episodes of vomiting and diarrhea. States that he thinks he needs fluids, NAD

## 2016-10-05 NOTE — ED Notes (Signed)
Pt verbalized understanding discharge instructions and denies any further needs or questions at this time. VS stable, ambulatory and steady gait.   

## 2017-11-08 ENCOUNTER — Emergency Department (HOSPITAL_COMMUNITY)
Admission: EM | Admit: 2017-11-08 | Discharge: 2017-11-08 | Disposition: A | Discharge status: Home / Self Care | Payer: Self-pay | Attending: Emergency Medicine | Admitting: Emergency Medicine

## 2017-11-08 ENCOUNTER — Encounter (HOSPITAL_COMMUNITY): Payer: Self-pay | Admitting: *Deleted

## 2017-11-08 ENCOUNTER — Emergency Department (HOSPITAL_COMMUNITY): Payer: Self-pay

## 2017-11-08 ENCOUNTER — Other Ambulatory Visit: Payer: Self-pay

## 2017-11-08 DIAGNOSIS — R110 Nausea: Secondary | ICD-10-CM | POA: Insufficient documentation

## 2017-11-08 DIAGNOSIS — R1010 Upper abdominal pain, unspecified: Secondary | ICD-10-CM | POA: Insufficient documentation

## 2017-11-08 DIAGNOSIS — K219 Gastro-esophageal reflux disease without esophagitis: Secondary | ICD-10-CM | POA: Insufficient documentation

## 2017-11-08 DIAGNOSIS — F17210 Nicotine dependence, cigarettes, uncomplicated: Secondary | ICD-10-CM | POA: Insufficient documentation

## 2017-11-08 LAB — COMPREHENSIVE METABOLIC PANEL
ALBUMIN: 5 g/dL (ref 3.5–5.0)
ALT: 18 U/L (ref 17–63)
AST: 31 U/L (ref 15–41)
Alkaline Phosphatase: 68 U/L (ref 38–126)
Anion gap: 8 (ref 5–15)
BILIRUBIN TOTAL: 0.5 mg/dL (ref 0.3–1.2)
BUN: 23 mg/dL — AB (ref 6–20)
CHLORIDE: 102 mmol/L (ref 101–111)
CO2: 27 mmol/L (ref 22–32)
Calcium: 9.9 mg/dL (ref 8.9–10.3)
Creatinine, Ser: 1.22 mg/dL (ref 0.61–1.24)
GFR calc Af Amer: >60 mL/min (ref >60)
GFR calc non Af Amer: >60 mL/min (ref >60)
GLUCOSE: 137 mg/dL — AB (ref 65–99)
Potassium: 3.7 mmol/L (ref 3.5–5.1)
Sodium: 137 mmol/L (ref 135–145)
Total Protein: 8.7 g/dL — ABNORMAL HIGH (ref 6.5–8.1)

## 2017-11-08 LAB — CBC
HCT: 40.8 % (ref 39.0–52.0)
Hemoglobin: 13.8 g/dL (ref 13.0–17.0)
MCH: 28.6 pg (ref 26.0–34.0)
MCHC: 33.8 g/dL (ref 30.0–36.0)
MCV: 84.6 fL (ref 78.0–100.0)
Platelets: 269 10*3/uL (ref 150–400)
RBC: 4.82 MIL/uL (ref 4.22–5.81)
RDW: 13.1 % (ref 11.5–15.5)
WBC: 6.9 10*3/uL (ref 4.0–10.5)

## 2017-11-08 LAB — LIPASE, BLOOD: Lipase: 35 U/L (ref 11–51)

## 2017-11-08 MED ORDER — ONDANSETRON 4 MG PO TBDP: 4.0000 mg | ORAL_TABLET | Freq: Once | ORAL | Status: DC

## 2017-11-08 MED ORDER — ONDANSETRON HCL 4 MG/2ML IJ SOLN
4.0000 mg | Freq: Once | INTRAMUSCULAR | Status: AC
  Administered 2017-11-08: 4 mg via INTRAVENOUS
  Filled 2017-11-08: qty 2

## 2017-11-08 MED ORDER — OMEPRAZOLE 40 MG PO CPDR: 40.0000 mg | DELAYED_RELEASE_CAPSULE | Freq: Every day | ORAL | 1 refills | Status: AC

## 2017-11-08 MED ORDER — PROMETHAZINE HCL 25 MG PO TABS: 25.0000 mg | ORAL_TABLET | Freq: Four times a day (QID) | ORAL | 0 refills | Status: AC | PRN

## 2017-11-08 MED ORDER — GI COCKTAIL ~~LOC~~
30.0000 mL | Freq: Once | ORAL | Status: AC
  Administered 2017-11-08: 30 mL via ORAL
  Filled 2017-11-08: qty 30

## 2017-11-08 MED ORDER — PANTOPRAZOLE SODIUM 40 MG IV SOLR
40.0000 mg | Freq: Once | INTRAVENOUS | Status: AC
  Administered 2017-11-08: 40 mg via INTRAVENOUS
  Filled 2017-11-08: qty 40

## 2017-11-08 MED ORDER — SODIUM CHLORIDE 0.9 % IV BOLUS (SEPSIS)
1000.0000 mL | Freq: Once | INTRAVENOUS | Status: AC
  Administered 2017-11-08: 1000 mL via INTRAVENOUS

## 2017-11-08 MED ORDER — DICYCLOMINE HCL 10 MG/ML IM SOLN
20.0000 mg | Freq: Once | INTRAMUSCULAR | Status: AC
  Administered 2017-11-08: 20 mg via INTRAMUSCULAR
  Filled 2017-11-08: qty 2

## 2017-11-08 NOTE — ED Notes (Signed)
Pt called for triage, no response. 

## 2017-11-08 NOTE — ED Notes (Signed)
Pt aware that urine sample is needed. Pt given urinal 

## 2017-11-08 NOTE — ED Triage Notes (Signed)
Pt stated "I started having stomach pain tonight.  I also have nausea.  I had a little bit of bloody stool which is normal for me."  Pt denies vomiting/diarrhea.

## 2017-11-08 NOTE — ED Provider Notes (Signed)
TIME SEEN: 3:52 AM  CHIEF COMPLAINT: Abdominal pain  HPI: Patient is a 27 year old male with history of asthma, previous appendectomy who presents to the emergency department with upper abdominal pain.  Describes it as a sharp pain without radiation.  Started this evening.  States the last thing he ate was a burger.  States he had decreased appetite tonight.  Has no nausea no vomiting.  States he always has a little bit of bright red blood per rectum with bowel movements.  This is not a new thing for him.  No melena.  Symptoms worse after eating.  ROS: See HPI Constitutional: no fever  Eyes: no drainage  ENT: no runny nose   Cardiovascular:  no chest pain  Resp: no SOB  GI: no vomiting GU: no dysuria Integumentary: no rash  Allergy: no hives  Musculoskeletal: no leg swelling  Neurological: no slurred speech ROS otherwise negative  PAST MEDICAL HISTORY/PAST SURGICAL HISTORY:  Past Medical History:  Diagnosis Date  . Asthma   . Dehydration     MEDICATIONS:  Prior to Admission medications   Medication Sig Start Date End Date Taking? Authorizing Provider  acetaminophen (TYLENOL) 325 MG tablet Take 650 mg by mouth every 6 (six) hours as needed for mild pain.   Yes [provider]  albuterol (PROVENTIL HFA;VENTOLIN HFA) 108 (90 BASE) MCG/ACT inhaler Inhale 1 puff into the lungs every 6 (six) hours as needed for wheezing or shortness of breath.   Yes [provider]    ALLERGIES:  Allergies  Allergen Reactions  . Robitussin Nighttime Cough Dm [Doxylamine-Dm]     Causes asthma to flare up    SOCIAL HISTORY:  Social History   Tobacco Use  . Smoking status: Current Every Day Smoker  . Smokeless tobacco: Never Used  . Tobacco comment: form given 10/09/14  Substance Use Topics  . Alcohol use: No    Alcohol/week: 0.0 oz    FAMILY HISTORY: Family History  Problem Relation Age of Onset  . Colon cancer Paternal Grandfather   . Diabetes Mother   . Diabetes  Maternal Grandmother   . Diabetes Paternal Grandmother   . Esophageal cancer Neg Hx   . Liver disease Neg Hx   . Kidney disease Neg Hx   . Heart disease Neg Hx     EXAM: BP (!) 152/81 (BP Location: Right Arm)   Pulse 88   Temp 98.1 F (36.7 C) (Oral)   Resp 20   Ht 5\' 8"  (1.727 m)   Wt 66.7 kg (147 lb)   BMI 22.35 kg/m  CONSTITUTIONAL: Alert and oriented and responds appropriately to questions. Well-appearing; well-nourished HEAD: Normocephalic EYES: Conjunctivae clear, pupils appear equal, EOMI ENT: normal nose; moist mucous membranes NECK: Supple, no meningismus, no nuchal rigidity, no LAD  CARD: RRR; S1 and S2 appreciated; no murmurs, no clicks, no rubs, no gallops RESP: Normal chest excursion without splinting or tachypnea; breath sounds clear and equal bilaterally; no wheezes, no rhonchi, no rales, no hypoxia or respiratory distress, speaking full sentences ABD/GI: Normal bowel sounds; non-distended; soft, tender throughout the upper abdomen especially in the epigastric region and right upper quadrant with negative Murphy sign no rebound, no guarding, no peritoneal signs, no hepatosplenomegaly BACK:  The back appears normal and is non-tender to palpation, there is no CVA tenderness EXT: Normal ROM in all joints; non-tender to palpation; no edema; normal capillary refill; no cyanosis, no calf tenderness or swelling    SKIN: Normal color for age and  race; warm; no rash NEURO: Moves all extremities equally PSYCH: The patient's mood and manner are appropriate. Grooming and personal hygiene are appropriate.  MEDICAL DECISION MAKING: Patient here with upper abdominal pain.  Differential includes gastritis, GERD, cholelithiasis, cholecystitis, pancreatitis.  Will obtain labs, urine and right upper quadrant ultrasound.  Will treat with Bentyl, Zofran, Protonix, GI cocktail.  ED PROGRESS: Patient's right upper quadrant ultrasound is normal.  Labs unremarkable.  Normal creatinine, LFTs,  lipase.  No leukocytosis.  He is not having any urinary symptoms.  Reports symptoms completely resolved after GI cocktail.  Suspect GERD.  Will discharge with omeprazole, Phenergan and have advised him to use Maalox over-the-counter as needed.  Discussed return precautions.  Given outpatient PCP and GI follow up.   At this time, I do not feel there is any life-threatening condition present. I have reviewed and discussed all results (EKG, imaging, lab, urine as appropriate) and exam findings with patient/family. I have reviewed nursing notes and appropriate previous records.  I feel the patient is safe to be discharged home without further emergent workup and can continue workup as an outpatient as needed. Discussed usual and customary return precautions. Patient/family verbalize understanding and are comfortable with this plan.  Outpatient follow-up has been provided if needed. All questions have been answered.      Eilee Schader, Layla MawKristen N, DO 11/08/17 [phone]

## 2017-11-08 NOTE — Discharge Instructions (Signed)
Please avoid NSAIDs such as aspirin (Goody powders), ibuprofen (Motrin, Advil), naproxen (Aleve) as these may worsen your symptoms.  Tylenol 1000 mg every 6 hours is safe to take as long as you have no history of liver problems (heavy alcohol use, cirrhosis, hepatitis).  Please avoid spicy, acidic (citrus fruits, tomato based sauces, salsa), greasy, fatty foods.  Please avoid caffeine and alcohol.  You may use Maalox over-the-counter if you have return of symptoms.   To find a primary care or specialty doctor please call [phone] or [phone] to access "[facility] Find a Doctor Service."  You may also go on the [facility] website at [email or web address]  There are also multiple [facility], [facility], [facility] and [facility] practices throughout the [place] that are frequently accepting new patients. You may find a clinic that is close to your home and contact them.  [facility] -  [address] [place] [id number] [phone]   [facility] -  [address] [place] [id number] [phone]   [facility] [phone]  [place] [phone]

## 2017-12-02 ENCOUNTER — Other Ambulatory Visit: Payer: Self-pay

## 2017-12-02 ENCOUNTER — Emergency Department (HOSPITAL_COMMUNITY)
Admission: EM | Admit: 2017-12-02 | Discharge: 2017-12-02 | Disposition: A | Discharge status: Home / Self Care | Payer: Self-pay | Attending: Emergency Medicine | Admitting: Emergency Medicine

## 2017-12-02 ENCOUNTER — Encounter (HOSPITAL_COMMUNITY): Payer: Self-pay | Admitting: Emergency Medicine

## 2017-12-02 DIAGNOSIS — F17200 Nicotine dependence, unspecified, uncomplicated: Secondary | ICD-10-CM | POA: Insufficient documentation

## 2017-12-02 DIAGNOSIS — J45909 Unspecified asthma, uncomplicated: Secondary | ICD-10-CM | POA: Insufficient documentation

## 2017-12-02 DIAGNOSIS — Z79899 Other long term (current) drug therapy: Secondary | ICD-10-CM | POA: Insufficient documentation

## 2017-12-02 DIAGNOSIS — K649 Unspecified hemorrhoids: Secondary | ICD-10-CM

## 2017-12-02 MED ORDER — HYDROCORTISONE 2.5 % RE CREA: TOPICAL_CREAM | RECTAL | 1 refills | Status: AC

## 2017-12-02 NOTE — Discharge Instructions (Signed)
Anusol rotated with Preparation H twice daily.  Metamucil: One heaping teaspoon in a glass of water 3 times daily.  Colace (Equate Stool Softener) 100 mg twice daily.  Follow-up with general surgery if symptoms are not improving in the next 1-2 weeks.

## 2017-12-02 NOTE — ED Provider Notes (Signed)
[facility]-EMERGENCY DEPT Provider Note   CSN: [id number] Arrival date & time: 12/02/17  0120     History   Chief Complaint Chief Complaint  Patient presents with  . Abdominal Pain  . Rectal Bleeding    HPI Jesus Lewis is a 27 y.o. male.  Patient is a 27 year old male with history of chronic hemorrhoids presenting with rectal pain and rectal bleeding.  He has had these issues for many years, however it has recently worsened.  He reports significant discomfort with having a bowel movement.  He seems to have bleeding along with every bowel movement as well.  He denies any fevers or chills.   The history is provided by the patient.  Rectal Bleeding  Quality:  Bright red Amount:  Moderate Timing:  Intermittent Chronicity:  Recurrent Context: defecation, hemorrhoids and rectal pain   Context: not diarrhea   Pain details:    Severity:  Moderate   Timing:  Constant   Progression:  Worsening Relieved by:  Nothing Worsened by:  Nothing   Past Medical History:  Diagnosis Date  . Asthma   . Dehydration     There are no active problems to display for this patient.   Past Surgical History:  Procedure Laterality Date  . APPENDECTOMY         Home Medications    Prior to Admission medications   Medication Sig Start Date End Date Taking? Authorizing Provider  acetaminophen (TYLENOL) 325 MG tablet Take 650 mg by mouth every 6 (six) hours as needed for mild pain.    [provider]  albuterol (PROVENTIL HFA;VENTOLIN HFA) 108 (90 BASE) MCG/ACT inhaler Inhale 1 puff into the lungs every 6 (six) hours as needed for wheezing or shortness of breath.    [provider]  omeprazole (PRILOSEC) 40 MG capsule Take 1 capsule (40 mg total) by mouth daily. 11/08/17   Ward, Layla MawKristen N, DO  promethazine (PHENERGAN) 25 MG tablet Take 1 tablet (25 mg total) by mouth every 6 (six) hours as needed for nausea or vomiting. 11/08/17   Ward, Layla MawKristen N, DO     Family History Family History  Problem Relation Age of Onset  . Colon cancer Paternal Grandfather   . Diabetes Mother   . Diabetes Maternal Grandmother   . Diabetes Paternal Grandmother   . Esophageal cancer Neg Hx   . Liver disease Neg Hx   . Kidney disease Neg Hx   . Heart disease Neg Hx     Social History Social History   Tobacco Use  . Smoking status: Current Every Day Smoker  . Smokeless tobacco: Never Used  . Tobacco comment: form given 10/09/14  Substance Use Topics  . Alcohol use: No    Alcohol/week: 0.0 oz  . Drug use: Yes    Types: Marijuana    Comment: denies     Allergies   Robitussin nighttime cough dm [doxylamine-dm]   Review of Systems Review of Systems  Gastrointestinal: Positive for hematochezia.  All other systems reviewed and are negative.    Physical Exam Updated Vital Signs BP (!) 148/89 (BP Location: Right Arm)   Pulse 61   Temp (!) 97.4 F (36.3 C) (Oral)   Resp 16   SpO2 100%   Physical Exam  Constitutional: He appears well-developed and well-nourished.  Non-toxic appearance. He does not appear ill. No distress.  HENT:  Head: Normocephalic.  Cardiovascular: Normal rate and regular rhythm.  Pulmonary/Chest: Effort normal.  Abdominal: Bowel sounds  are normal. He exhibits no distension. There is no tenderness.  Genitourinary:  Genitourinary Comments: Patient has multiple hemorrhoids that appear inflamed, but not actively bleeding.  Nursing note and vitals reviewed.    ED Treatments / Results  Labs (all labs ordered are listed, but only abnormal results are displayed) Labs Reviewed - No data to display  EKG  EKG Interpretation None       Radiology No results found.  Procedures Procedures (including critical care time)  Medications Ordered in ED Medications - No data to display   Initial Impression / Assessment and Plan / ED Course  I have reviewed the triage vital signs and the nursing notes.  Pertinent  labs & imaging results that were available during my care of the patient were reviewed by me and considered in my medical decision making (see chart for details).  Patient with chronic hemorrhoids that have been worsening in pain, size, and bleeding recently.  He will be treated with Preparation H, Anusol, Metamucil, and Colace.  He is to follow-up with general surgery if not improving in the next 2 weeks.  Final Clinical Impressions(s) / ED Diagnoses   Final diagnoses:  None    ED Discharge Orders    None       Geoffery Lyons, MD 12/02/17 [phone]

## 2017-12-02 NOTE — ED Triage Notes (Signed)
Pt is c/o abd pain sharp in nature  States he has had pain in his abdomen practically all his life but tonight he is having rectal bleeding dark red with clots noted  Pt has hx of same but states tonight it is heavier than normal

## 2018-01-04 ENCOUNTER — Ambulatory Visit: Payer: Self-pay | Admitting: Gastroenterology

## 2018-09-04 ENCOUNTER — Emergency Department (HOSPITAL_COMMUNITY)
Admission: EM | Admit: 2018-09-04 | Discharge: 2018-09-05 | Disposition: A | Discharge status: Home / Self Care | Payer: Self-pay | Attending: Emergency Medicine | Admitting: Emergency Medicine

## 2018-09-04 ENCOUNTER — Encounter (HOSPITAL_COMMUNITY): Payer: Self-pay | Admitting: Emergency Medicine

## 2018-09-04 ENCOUNTER — Other Ambulatory Visit: Payer: Self-pay

## 2018-09-04 DIAGNOSIS — J45909 Unspecified asthma, uncomplicated: Secondary | ICD-10-CM | POA: Insufficient documentation

## 2018-09-04 DIAGNOSIS — K0889 Other specified disorders of teeth and supporting structures: Secondary | ICD-10-CM | POA: Insufficient documentation

## 2018-09-04 DIAGNOSIS — Z79899 Other long term (current) drug therapy: Secondary | ICD-10-CM | POA: Insufficient documentation

## 2018-09-04 DIAGNOSIS — F17200 Nicotine dependence, unspecified, uncomplicated: Secondary | ICD-10-CM | POA: Insufficient documentation

## 2018-09-04 NOTE — ED Triage Notes (Signed)
Patient is complain of a hole in his tooth on the left upper side. Patient states that the hole has been getting bigger for a year. The last 4 days it has been very painful.

## 2018-09-05 MED ORDER — AMOXICILLIN-POT CLAVULANATE 875-125 MG PO TABS
1.0000 | ORAL_TABLET | Freq: Once | ORAL | Status: AC
  Administered 2018-09-05: 1 via ORAL
  Filled 2018-09-05: qty 1

## 2018-09-05 MED ORDER — AMOXICILLIN-POT CLAVULANATE 875-125 MG PO TABS: 1.0000 | ORAL_TABLET | Freq: Two times a day (BID) | ORAL | 0 refills | Status: DC

## 2018-09-05 NOTE — Discharge Instructions (Addendum)
Continue ibuprofen or Aleve for management of your dental pain.  Take Augmentin as prescribed until finished.  Follow-up with a dentist.  Only a dentist can fix your problem.  Refer to the resource guide attached for dental services in the area.

## 2018-09-05 NOTE — ED Provider Notes (Signed)
[facility]-EMERGENCY DEPT Provider Note   CSN: [id number] Arrival date & time: 09/04/18  2304     History   Chief Complaint Chief Complaint  Patient presents with  . Dental Pain    HPI Jesus Lewis is a 27 y.o. male.  The history is provided by the patient. No language interpreter was used.  Dental Pain   This is a new problem. Episode onset: 4 days ago. The problem occurs constantly. The problem has been gradually worsening. The pain is moderate. Treatments tried: ibuprofen. The treatment provided mild relief.    Past Medical History:  Diagnosis Date  . Asthma   . Dehydration     There are no active problems to display for this patient.   Past Surgical History:  Procedure Laterality Date  . APPENDECTOMY          Home Medications    Prior to Admission medications   Medication Sig Start Date End Date Taking? Authorizing Provider  acetaminophen (TYLENOL) 325 MG tablet Take 650 mg by mouth every 6 (six) hours as needed for mild pain.    [provider]  albuterol (PROVENTIL HFA;VENTOLIN HFA) 108 (90 BASE) MCG/ACT inhaler Inhale 1 puff into the lungs every 6 (six) hours as needed for wheezing or shortness of breath.    [provider]  amoxicillin-clavulanate (AUGMENTIN) 875-125 MG tablet Take 1 tablet by mouth every 12 (twelve) hours. 09/05/18   Antony MaduraHumes, Lynda Wanninger, PA-C  hydrocortisone (ANUSOL-HC) 2.5 % rectal cream Apply rectally 2 times daily 12/02/17   Geoffery Lyonselo, Douglas, MD  omeprazole (PRILOSEC) 40 MG capsule Take 1 capsule (40 mg total) by mouth daily. 11/08/17   Ward, Layla MawKristen N, DO  promethazine (PHENERGAN) 25 MG tablet Take 1 tablet (25 mg total) by mouth every 6 (six) hours as needed for nausea or vomiting. 11/08/17   Ward, Layla MawKristen N, DO    Family History Family History  Problem Relation Age of Onset  . Colon cancer Paternal Grandfather   . Diabetes Mother   . Diabetes Maternal Grandmother   . Diabetes Paternal Grandmother   .  Esophageal cancer Neg Hx   . Liver disease Neg Hx   . Kidney disease Neg Hx   . Heart disease Neg Hx     Social History Social History   Tobacco Use  . Smoking status: Current Every Day Smoker  . Smokeless tobacco: Never Used  . Tobacco comment: form given 10/09/14  Substance Use Topics  . Alcohol use: No    Alcohol/week: 0.0 standard drinks  . Drug use: Yes    Types: Marijuana    Comment: denies     Allergies   Robitussin nighttime cough dm [doxylamine-dm]   Review of Systems Review of Systems  Constitutional: Negative for fever.  HENT: Positive for dental problem.   Ten systems reviewed and are negative for acute change, except as noted in the HPI.    Physical Exam Updated Vital Signs BP (!) 141/80 (BP Location: Left Arm)   Pulse (!) 57   Temp 98.1 F (36.7 C) (Oral)   Resp 16   Ht 5\' 9"  (1.753 m)   Wt 70.3 kg   SpO2 100%   BMI 22.89 kg/m   Physical Exam  Constitutional: He is oriented to person, place, and time. He appears well-developed and well-nourished. No distress.  Nontoxic appearing and in NAD  HENT:  Head: Normocephalic and atraumatic.  Cracked dentition to bilateral upper 1st molars. TTP to the L upper 1st  molar without gingival fluctuance, drainage.  No trismus.  Tolerating secretions.  Eyes: Conjunctivae and EOM are normal. No scleral icterus.  Neck: Normal range of motion.  Pulmonary/Chest: Effort normal. No respiratory distress.  Respirations even and unlabored  Musculoskeletal: Normal range of motion.  Neurological: He is alert and oriented to person, place, and time. He exhibits normal muscle tone. Coordination normal.  Skin: Skin is warm and dry. No rash noted. He is not diaphoretic. No erythema. No pallor.  Psychiatric: He has a normal mood and affect. His behavior is normal.  Nursing note and vitals reviewed.    ED Treatments / Results  Labs (all labs ordered are listed, but only abnormal results are displayed) Labs Reviewed - No  data to display  EKG None  Radiology No results found.  Procedures Procedures (including critical care time)  Medications Ordered in ED Medications  amoxicillin-clavulanate (AUGMENTIN) 875-125 MG per tablet 1 tablet (has no administration in time range)     Initial Impression / Assessment and Plan / ED Course  I have reviewed the triage vital signs and the nursing notes.  Pertinent labs & imaging results that were available during my care of the patient were reviewed by me and considered in my medical decision making (see chart for details).     Patient with toothache.  No gross abscess.  Exam unconcerning for Ludwig's angina or spread of infection.  Will treat with Augmentin and NSAIDs.  Urged patient to follow-up with dentist.  Return precautions discussed and provided. Patient discharged in stable condition with no unaddressed concerns.   Final Clinical Impressions(s) / ED Diagnoses   Final diagnoses:  Dentalgia    ED Discharge Orders         Ordered    amoxicillin-clavulanate (AUGMENTIN) 875-125 MG tablet  Every 12 hours     09/05/18 0014           Antony Madura, PA-C 09/05/18 0022    Nira Conn, MD 09/05/18 [phone]

## 2021-10-06 ENCOUNTER — Encounter (HOSPITAL_COMMUNITY): Payer: Self-pay | Admitting: Emergency Medicine

## 2021-10-06 ENCOUNTER — Other Ambulatory Visit: Payer: Self-pay

## 2021-10-06 ENCOUNTER — Emergency Department (HOSPITAL_COMMUNITY)
Admission: EM | Admit: 2021-10-06 | Discharge: 2021-10-06 | Disposition: A | Discharge status: Home / Self Care | Payer: Self-pay | Attending: Emergency Medicine | Admitting: Emergency Medicine

## 2021-10-06 DIAGNOSIS — K047 Periapical abscess without sinus: Secondary | ICD-10-CM | POA: Insufficient documentation

## 2021-10-06 DIAGNOSIS — K029 Dental caries, unspecified: Secondary | ICD-10-CM | POA: Insufficient documentation

## 2021-10-06 DIAGNOSIS — K0889 Other specified disorders of teeth and supporting structures: Secondary | ICD-10-CM

## 2021-10-06 MED ORDER — OXYCODONE-ACETAMINOPHEN 5-325 MG PO TABS
1.0000 | ORAL_TABLET | Freq: Once | ORAL | Status: AC
  Administered 2021-10-06: 1 via ORAL
  Filled 2021-10-06: qty 1

## 2021-10-06 MED ORDER — AMOXICILLIN-POT CLAVULANATE 875-125 MG PO TABS: 1.0000 | ORAL_TABLET | Freq: Two times a day (BID) | ORAL | 0 refills | Status: AC

## 2021-10-06 NOTE — ED Notes (Signed)
Pt moaning in pain. Medicated with percocet at this time for pain 10/10.

## 2021-10-06 NOTE — ED Triage Notes (Signed)
Patient c/o right sided dental pain that has been going on for over 8 months and worse over the past 2 months. States his insurance just kicked in. States pain is worse and swelling right side of face. History of similar on left side and pulled that tooth himself.

## 2021-10-06 NOTE — Discharge Instructions (Addendum)
You likely have an infection in your tooth and that is why it is so painful.  I have given you antibiotics to manage this infection.  Take entire dose as prescribed.  However, the most important thing to do is to see a dentist for further management.  I have enclosed a resource guide to help you with this.  Return if development of any new or worsening symptoms.

## 2021-10-06 NOTE — ED Notes (Signed)
Dc instructions reviewed with pt. Pt to follow up with dentist as soon as possible. No questions or concerns. Pt being transported home by friend.

## 2021-10-06 NOTE — ED Provider Notes (Signed)
[facility]-EMERGENCY DEPT Provider Note   CSN: [id number] Arrival date & time: 10/06/21  1714     History  Chief Complaint  Patient presents with   Dental Pain    Jesus Lewis is a 31 y.o. male.  Patient with no pertinent past medical history presents today with chief complaint of dental pain.  He states that symptoms been ongoing for 8 months but in the past week has become unbearable.  He states that the pain was originally on the left side of his mouth, however he pulled out his tooth with vice grips and that gave him relief.  However, pain now on the right upper side of his mouth.  He has not been able to get into see a dentist due to not having dental insurance until recently.  The history is provided by the patient. No language interpreter was used.  Dental Pain Associated symptoms: no facial swelling, no fever and no oral lesions       Home Medications Prior to Admission medications   Medication Sig Start Date End Date Taking? Authorizing Provider  acetaminophen (TYLENOL) 325 MG tablet Take 650 mg by mouth every 6 (six) hours as needed for mild pain.    [provider]  albuterol (PROVENTIL HFA;VENTOLIN HFA) 108 (90 BASE) MCG/ACT inhaler Inhale 1 puff into the lungs every 6 (six) hours as needed for wheezing or shortness of breath.    [provider]  amoxicillin-clavulanate (AUGMENTIN) 875-125 MG tablet Take 1 tablet by mouth every 12 (twelve) hours. 09/05/18   Antony Madura, PA-C  hydrocortisone (ANUSOL-HC) 2.5 % rectal cream Apply rectally 2 times daily 12/02/17   Geoffery Lyons, MD  omeprazole (PRILOSEC) 40 MG capsule Take 1 capsule (40 mg total) by mouth daily. 11/08/17   Ward, Layla Maw, DO  promethazine (PHENERGAN) 25 MG tablet Take 1 tablet (25 mg total) by mouth every 6 (six) hours as needed for nausea or vomiting. 11/08/17   Ward, Layla Maw, DO      Allergies    Robitussin nighttime cough dm [doxylamine-dm]    Review of Systems    Review of Systems  Constitutional:  Negative for chills and fever.  HENT:  Positive for dental problem. Negative for facial swelling, hearing loss, mouth sores and trouble swallowing.   Gastrointestinal:  Negative for diarrhea, nausea and vomiting.  All other systems reviewed and are negative.  Physical Exam Updated Vital Signs BP (!) 140/92 (BP Location: Left Arm)    Pulse 74    Temp 98.2 F (36.8 C) (Oral)    Resp 16    SpO2 100%  Physical Exam Vitals and nursing note reviewed.  Constitutional:      General: He is not in acute distress.    Appearance: Normal appearance. He is normal weight. He is not ill-appearing, toxic-appearing or diaphoretic.     Comments: Patient sitting comfortably in chair in no acute distress  HENT:     Mouth/Throat:     Mouth: Mucous membranes are moist.     Dentition: Does not have dentures. Dental tenderness and dental caries present. No gingival swelling, dental abscesses or gum lesions.     Tongue: No lesions. Tongue does not deviate from midline.     Palate: No mass and lesions.     Comments: Several cavities present throughout the mouth with pain to palpation of top right molars.  No obvious abscess or facial swelling noted.  No signs of Ludwig's angina. Cardiovascular:  Rate and Rhythm: Normal rate.  Pulmonary:     Effort: Pulmonary effort is normal. No respiratory distress.  Neurological:     Mental Status: He is alert.    ED Results / Procedures / Treatments   Labs (all labs ordered are listed, but only abnormal results are displayed) Labs Reviewed - No data to display  EKG None  Radiology No results found.  Procedures Procedures    Medications Ordered in ED Medications  oxyCODONE-acetaminophen (PERCOCET/ROXICET) 5-325 MG per tablet 1 tablet (1 tablet Oral Given 10/06/21 1943)    ED Course/ Medical Decision Making/ A&P                           Medical Decision Making  Patient presents today with dental pain. No gross  abscess.  Exam unconcerning for Ludwig's angina or spread of infection.  He is afebrile, nontoxic-appearing, and in no acute distress with reassuring vital signs.  Will treat with Augmentin and OTC anti-inflammatories medicine.  Urged patient to follow-up with dentist.  He is expresses understanding, educated on red flag symptoms of prompt immediate return.  He has plans to call dentist tomorrow.  Discharged in stable condition.  Final Clinical Impression(s) / ED Diagnoses Final diagnoses:  Dental infection  Dental caries  Pain, dental    Rx / DC Orders ED Discharge Orders          Ordered    amoxicillin-clavulanate (AUGMENTIN) 875-125 MG tablet  Every 12 hours        10/06/21 1951          An After Visit Summary was printed and given to the patient.     Vear Clock 10/06/21 2009    Mancel Bale, MD 10/06/21 [phone]

## 2021-10-06 NOTE — ED Provider Triage Note (Signed)
Emergency Medicine Provider Triage Evaluation Note  Jesus Lewis , a 31 y.o. male  was evaluated in triage.  Pt complains of dull pain.  Patient states that he has had right upper dental pain for a number of months.  He states that this is progressively worsened to now he feels like he gets pain in his ear, neck and at times blurry vision due to the pain on the same side.  He denies fevers.  He does not have a dentist.  He states that he just started at [facility] [employer] and his insurance kicks in soon but has not been able to establish a dentist yet. Review of Systems  Positive: See above Negative:   Physical Exam  BP (!) 140/92 (BP Location: Left Arm)    Pulse 74    Temp 98.2 F (36.8 C) (Oral)    Resp 16    SpO2 100%  Gen:   Awake, no distress   Resp:  Normal effort  MSK:   Moves extremities without difficulty  Other:  Severe dental pain to the right upper premolars and molars.  Dental caries.  No obvious abscess or fluctuance.  No facial swelling.  Medical Decision Making  Medically screening exam initiated at 6:14 PM.  Appropriate orders placed.  ADONUS USELMAN was informed that the remainder of the evaluation will be completed by another provider, this initial triage assessment does not replace that evaluation, and the importance of remaining in the ED until their evaluation is complete.     Cristopher Peru, PA-C 10/06/21 1815

## 2021-10-14 ENCOUNTER — Encounter (HOSPITAL_COMMUNITY): Payer: Self-pay

## 2021-10-14 ENCOUNTER — Other Ambulatory Visit: Payer: Self-pay

## 2021-10-14 ENCOUNTER — Emergency Department (HOSPITAL_COMMUNITY)
Admission: EM | Admit: 2021-10-14 | Discharge: 2021-10-15 | Disposition: A | Discharge status: Home / Self Care | Payer: Self-pay | Attending: Emergency Medicine | Admitting: Emergency Medicine

## 2021-10-14 DIAGNOSIS — K644 Residual hemorrhoidal skin tags: Secondary | ICD-10-CM | POA: Insufficient documentation

## 2021-10-14 DIAGNOSIS — K645 Perianal venous thrombosis: Secondary | ICD-10-CM

## 2021-10-14 DIAGNOSIS — J45909 Unspecified asthma, uncomplicated: Secondary | ICD-10-CM | POA: Insufficient documentation

## 2021-10-14 LAB — CBC WITH DIFFERENTIAL/PLATELET
Abs Immature Granulocytes: 0.02 10*3/uL (ref 0.00–0.07)
Basophils Absolute: 0 10*3/uL (ref 0.0–0.1)
Basophils Relative: 0 %
Eosinophils Absolute: 0.1 10*3/uL (ref 0.0–0.5)
Eosinophils Relative: 1 %
HCT: 45.4 % (ref 39.0–52.0)
Hemoglobin: 14.8 g/dL (ref 13.0–17.0)
Immature Granulocytes: 0 %
Lymphocytes Relative: 39 %
Lymphs Abs: 2.6 10*3/uL (ref 0.7–4.0)
MCH: 29 pg (ref 26.0–34.0)
MCHC: 32.6 g/dL (ref 30.0–36.0)
MCV: 88.8 fL (ref 80.0–100.0)
Monocytes Absolute: 0.6 10*3/uL (ref 0.1–1.0)
Monocytes Relative: 9 %
Neutro Abs: 3.4 10*3/uL (ref 1.7–7.7)
Neutrophils Relative %: 51 %
Platelets: 359 10*3/uL (ref 150–400)
RBC: 5.11 MIL/uL (ref 4.22–5.81)
RDW: 13 % (ref 11.5–15.5)
WBC: 6.8 10*3/uL (ref 4.0–10.5)
nRBC: 0 % (ref 0.0–0.2)

## 2021-10-14 MED ORDER — OXYCODONE-ACETAMINOPHEN 5-325 MG PO TABS
1.0000 | ORAL_TABLET | Freq: Once | ORAL | Status: AC
  Administered 2021-10-15: 1 via ORAL
  Filled 2021-10-14: qty 1

## 2021-10-14 NOTE — ED Triage Notes (Signed)
Pt complains of hemorrhoids and rectal bleeding x 3 days.

## 2021-10-14 NOTE — ED Provider Triage Note (Signed)
Emergency Medicine Provider Triage Evaluation Note  Jesus Lewis , a 31 y.o. male  was evaluated in triage.  Pt complains of bright red blood per rectum for the past 2 to 3 days, worsening in nature.  Patient reports history of external hemorrhoids and states that they have been very painful over the last 2 to 3 days as well.  He states that he is due to have oral surgery tomorrow and has been taking tramadol 50 mg every 6 hours.  He last took it about 6 hours ago without relief of his rectal pain.  Review of Systems  Positive: + rectal pain, BRBPR Negative: - abd pain, nausea, vomiting  Physical Exam  BP (!) 147/97 (BP Location: Right Arm)    Pulse (!) 104    Temp 98.1 F (36.7 C) (Oral)    Resp 16    SpO2 100%  Gen:   Awake, no distress   Resp:  Normal effort  MSK:   Moves extremities without difficulty  Other:  GU exam deferred in triage  Medical Decision Making  Medically screening exam initiated at 11:36 PM.  Appropriate orders placed.  JOCK MAHON was informed that the remainder of the evaluation will be completed by another provider, this initial triage assessment does not replace that evaluation, and the importance of remaining in the ED until their evaluation is complete.     Tanda Rockers, PA-C 10/14/21 2337

## 2021-10-15 LAB — BASIC METABOLIC PANEL
Anion gap: 13 (ref 5–15)
BUN: 23 mg/dL — ABNORMAL HIGH (ref 6–20)
CO2: 26 mmol/L (ref 22–32)
Calcium: 10.3 mg/dL (ref 8.9–10.3)
Chloride: 98 mmol/L (ref 98–111)
Creatinine, Ser: 1.31 mg/dL — ABNORMAL HIGH (ref 0.61–1.24)
GFR, Estimated: >60 mL/min (ref >60)
Glucose, Bld: 101 mg/dL — ABNORMAL HIGH (ref 70–99)
Potassium: 4.1 mmol/L (ref 3.5–5.1)
Sodium: 137 mmol/L (ref 135–145)

## 2021-10-15 LAB — TYPE AND SCREEN
ABO/RH(D): O POS
Antibody Screen: NEGATIVE

## 2021-10-15 MED ORDER — LIDOCAINE 5 % EX OINT: 1.0000 "application " | TOPICAL_OINTMENT | Freq: Three times a day (TID) | CUTANEOUS | 0 refills | Status: AC | PRN

## 2021-10-15 MED ORDER — NITROGLYCERIN 2 % TD OINT
0.5000 [in_us] | TOPICAL_OINTMENT | Freq: Once | TRANSDERMAL | Status: AC
  Administered 2021-10-15: 0.5 [in_us] via TOPICAL
  Filled 2021-10-15: qty 1

## 2021-10-15 MED ORDER — NITROGLYCERIN 2 % TD OINT: 0.5000 [in_us] | TOPICAL_OINTMENT | Freq: Three times a day (TID) | TRANSDERMAL | 0 refills | Status: AC

## 2021-10-15 NOTE — ED Provider Notes (Signed)
[facility]-EMERGENCY DEPT Provider Note  CSN: [id number] Arrival date & time: 10/14/21 2255  Chief Complaint(s) Hemorrhoids and Rectal Bleeding  HPI Jesus Lewis is a 31 y.o. male with several year history of recurring bleeding hemorrhoids here for 3 to 4 days of gradually worsening pain related to bleeding hemorrhoids.  Pain is not severe.  Exacerbated with bowel movements, sitting in certain positions.  Has tried over-the-counter medication without minimal relief.  Reports that he was previously scheduled for surgery but never went through with it.  Rectal Bleeding  Past Medical History Past Medical History:  Diagnosis Date   Asthma    Dehydration    There are no problems to display for this patient.  Home Medication(s) Prior to Admission medications   Medication Sig Start Date End Date Taking? Authorizing Provider  lidocaine (XYLOCAINE) 5 % ointment Apply 1 application topically 3 (three) times daily as needed. 10/15/21  Yes Elberta Lachapelle, Amadeo Garnet, MD  nitroGLYCERIN (NITROGLYN) 2 % ointment Apply 0.5-1 inches topically 3 (three) times daily. 10/15/21  Yes Jentry Mcqueary, Amadeo Garnet, MD  acetaminophen (TYLENOL) 325 MG tablet Take 650 mg by mouth every 6 (six) hours as needed for mild pain.    [provider]  albuterol (PROVENTIL HFA;VENTOLIN HFA) 108 (90 BASE) MCG/ACT inhaler Inhale 1 puff into the lungs every 6 (six) hours as needed for wheezing or shortness of breath.    [provider]  amoxicillin-clavulanate (AUGMENTIN) 875-125 MG tablet Take 1 tablet by mouth every 12 (twelve) hours. 10/06/21   Smoot, Shawn Route, PA-C  hydrocortisone (ANUSOL-HC) 2.5 % rectal cream Apply rectally 2 times daily 12/02/17   Geoffery Lyons, MD  omeprazole (PRILOSEC) 40 MG capsule Take 1 capsule (40 mg total) by mouth daily. 11/08/17   Ward, Layla Maw, DO  promethazine (PHENERGAN) 25 MG tablet Take 1 tablet (25 mg total) by mouth every 6 (six) hours as needed for nausea or  vomiting. 11/08/17   Ward, Layla Maw, DO                                                                                                                                    Allergies Robitussin nighttime cough dm [doxylamine-dm]  Review of Systems Review of Systems  Gastrointestinal:  Positive for hematochezia.  As noted in HPI  Physical Exam Vital Signs  I have reviewed the triage vital signs BP (!) 142/89 (BP Location: Right Arm)    Pulse 64    Temp 98.1 F (36.7 C) (Oral)    Resp 16    SpO2 100%   Physical Exam Vitals reviewed.  Constitutional:      General: He is not in acute distress.    Appearance: He is well-developed. He is not diaphoretic.  HENT:     Head: Normocephalic and atraumatic.     Right Ear: External ear normal.     Left Ear: External ear normal.  Nose: Nose normal.     Mouth/Throat:     Mouth: Mucous membranes are moist.  Eyes:     General: No scleral icterus.    Conjunctiva/sclera: Conjunctivae normal.  Neck:     Trachea: Phonation normal.  Cardiovascular:     Rate and Rhythm: Normal rate and regular rhythm.  Pulmonary:     Effort: Pulmonary effort is normal. No respiratory distress.     Breath sounds: No stridor.  Abdominal:     General: There is no distension.     Tenderness: There is no abdominal tenderness.  Genitourinary:    Rectum: External hemorrhoid (numerous. one large and thrombosed hemorrhoid at 3 o'clock) present.  Musculoskeletal:        General: Normal range of motion.     Cervical back: Normal range of motion.  Neurological:     Mental Status: He is alert and oriented to person, place, and time.  Psychiatric:        Behavior: Behavior normal.    ED Results and Treatments Labs (all labs ordered are listed, but only abnormal results are displayed) Labs Reviewed  BASIC METABOLIC PANEL - Abnormal; Notable for the following components:      Result Value   Glucose, Bld 101 (*)    BUN 23 (*)    Creatinine, Ser 1.31 (*)    All  other components within normal limits  CBC WITH DIFFERENTIAL/PLATELET  POC OCCULT BLOOD, ED  TYPE AND SCREEN  ABO/RH                                                                                                                         EKG  EKG Interpretation  Date/Time:    Ventricular Rate:    PR Interval:    QRS Duration:   QT Interval:    QTC Calculation:   R Axis:     Text Interpretation:         Radiology No results found.  Pertinent labs & imaging results that were available during my care of the patient were reviewed by me and considered in my medical decision making (see MDM for details).  Medications Ordered in ED Medications  oxyCODONE-acetaminophen (PERCOCET/ROXICET) 5-325 MG per tablet 1 tablet (1 tablet Oral Given 10/15/21 0022)  nitroGLYCERIN (NITROGLYN) 2 % ointment 0.5 inch (0.5 inches Topical Given 10/15/21 0529)  Procedures Procedures  (including critical care time)  Medical Decision Making / ED Course        Rectal pain Related to thrombosed hemorrhoid. Given duration of onset time, patient is not a candidate for I&D of the thrombosed hemorrhoid here in the emergency department.   Management: Provided with topical NTG  Reassessment: Some improvement.  Recommend continue topical medication, stool softeners, and surgery follow-up.  Final Clinical Impression(s) / ED Diagnoses Final diagnoses:  Thrombosed external hemorrhoid   The patient appears reasonably screened and/or stabilized for discharge and I doubt any other medical condition or other [facility] requiring further screening, evaluation, or treatment in the ED at this time prior to discharge. Safe for discharge with strict return precautions.  Disposition: Discharge  Condition: Good  I have discussed the results, Dx and Tx plan with the patient/family who  expressed understanding and agree(s) with the plan. Discharge instructions discussed at length. The patient/family was given strict return precautions who verbalized understanding of the instructions. No further questions at time of discharge.    ED Discharge Orders          Ordered    nitroGLYCERIN (NITROGLYN) 2 % ointment  3 times daily        10/15/21 0653    lidocaine (XYLOCAINE) 5 % ointment  3 times daily PRN        10/15/21 [id number]             Follow Up: [place] [phone] Call  to schedule an appointment for close follow up for hemorrhoid management           This chart was dictated using voice recognition software.  Despite best efforts to proofread,  errors can occur which can change the documentation meaning.    Fatima Blank, MD 10/15/21 [phone]

## 2021-10-15 NOTE — Discharge Instructions (Signed)
Increase hydration and follow a high fiber diet. Use a stool softener such as Colace, DulcoLax, or MiraLax to help soften stool and relieve pain with bowel movements. Sitz baths (ask pharmacist if you need help finding this) and Epsom salt baths will also help improve symptoms. Apply topical medications to hemorrhoid twice daily. If symptoms persist longer than 1-2 weeks, please follow up with your primary care provider. Please return to ER for new or worsening symptoms, any additional concerns.  ° °

## 2022-01-09 ENCOUNTER — Emergency Department (HOSPITAL_BASED_OUTPATIENT_CLINIC_OR_DEPARTMENT_OTHER): Payer: Self-pay | Admitting: Radiology

## 2022-01-09 ENCOUNTER — Encounter (HOSPITAL_BASED_OUTPATIENT_CLINIC_OR_DEPARTMENT_OTHER): Payer: Self-pay | Admitting: Emergency Medicine

## 2022-01-09 ENCOUNTER — Other Ambulatory Visit: Payer: Self-pay

## 2022-01-09 ENCOUNTER — Emergency Department (HOSPITAL_BASED_OUTPATIENT_CLINIC_OR_DEPARTMENT_OTHER)
Admission: EM | Admit: 2022-01-09 | Discharge: 2022-01-09 | Disposition: A | Discharge status: Home / Self Care | Payer: Self-pay | Attending: Emergency Medicine | Admitting: Emergency Medicine

## 2022-01-09 DIAGNOSIS — Y9367 Activity, basketball: Secondary | ICD-10-CM | POA: Insufficient documentation

## 2022-01-09 DIAGNOSIS — S63259A Unspecified dislocation of unspecified finger, initial encounter: Secondary | ICD-10-CM

## 2022-01-09 DIAGNOSIS — W51XXXA Accidental striking against or bumped into by another person, initial encounter: Secondary | ICD-10-CM | POA: Insufficient documentation

## 2022-01-09 DIAGNOSIS — S63267A Dislocation of metacarpophalangeal joint of left little finger, initial encounter: Secondary | ICD-10-CM | POA: Insufficient documentation

## 2022-01-09 MED ORDER — OXYCODONE-ACETAMINOPHEN 5-325 MG PO TABS
1.0000 | ORAL_TABLET | Freq: Once | ORAL | Status: AC
  Administered 2022-01-09: 1 via ORAL
  Filled 2022-01-09: qty 1

## 2022-01-09 NOTE — ED Provider Notes (Signed)
?[facility] EMERGENCY DEPT ?Provider Note ? ? ?CSN: [id number] ?Arrival date & time: 01/09/22  1731 ? ?  ? ?History ? ?Chief Complaint  ?Patient presents with  ? Hand Pain  ? ? ?Jesus Lewis is a 31 y.o. male here presenting with left hand injury.  Patient states that he was playing basketball and somebody pushed him into the wall.  He felt a pop in the left fifth finger.  He states that he had severe pain afterwards and was unable to move the finger.  ? ?The history is provided by the patient.  ? ?  ? ?Home Medications ?Prior to Admission medications   ?Medication Sig Start Date End Date Taking? Authorizing Provider  ?acetaminophen (TYLENOL) 325 MG tablet Take 650 mg by mouth every 6 (six) hours as needed for mild pain.    [provider]  ?albuterol (PROVENTIL HFA;VENTOLIN HFA) 108 (90 BASE) MCG/ACT inhaler Inhale 1 puff into the lungs every 6 (six) hours as needed for wheezing or shortness of breath.    [provider]  ?amoxicillin-clavulanate (AUGMENTIN) 875-125 MG tablet Take 1 tablet by mouth every 12 (twelve) hours. 10/06/21   Smoot, Shawn Route, PA-C  ?hydrocortisone (ANUSOL-HC) 2.5 % rectal cream Apply rectally 2 times daily 12/02/17   Geoffery Lyons, MD  ?lidocaine (XYLOCAINE) 5 % ointment Apply 1 application topically 3 (three) times daily as needed. 10/15/21   Nira Conn, MD  ?nitroGLYCERIN (NITROGLYN) 2 % ointment Apply 0.5-1 inches topically 3 (three) times daily. 10/15/21   Nira Conn, MD  ?omeprazole (PRILOSEC) 40 MG capsule Take 1 capsule (40 mg total) by mouth daily. 11/08/17   Ward, Layla Maw, DO  ?promethazine (PHENERGAN) 25 MG tablet Take 1 tablet (25 mg total) by mouth every 6 (six) hours as needed for nausea or vomiting. 11/08/17   Ward, Layla Maw, DO  ?   ? ?Allergies    ?Robitussin nighttime cough dm [doxylamine-dm]   ? ?Review of Systems   ?Review of Systems  ?Musculoskeletal:   ?     Left fifth finger pain  ?All other systems reviewed and are  negative. ? ?Physical Exam ?Updated Vital Signs ?BP 107/76 (BP Location: Right Arm)   Pulse 70   Temp 98.4 ?F (36.9 ?C) (Oral)   Resp 19   Ht 5\' 9"  (1.753 m)   Wt 72.6 kg   SpO2 100%   BMI 23.63 kg/m?  ?Physical Exam ?Vitals and nursing note reviewed.  ?Constitutional:   ?   Comments: Uncomfortable   ?HENT:  ?   Head: Normocephalic.  ?   Nose: Nose normal.  ?   Mouth/Throat:  ?   Mouth: Mucous membranes are moist.  ?Eyes:  ?   Extraocular Movements: Extraocular movements intact.  ?   Pupils: Pupils are equal, round, and reactive to light.  ?Cardiovascular:  ?   Rate and Rhythm: Normal rate.  ?Pulmonary:  ?   Effort: Pulmonary effort is normal.  ?Abdominal:  ?   General: Abdomen is flat.  ?Musculoskeletal:  ?   Cervical back: Normal range of motion.  ?   Comments: Left fifth MCP joint with obvious posterior dislocation.  Has some left wrist tenderness as well but able to range the wrist  ?Neurological:  ?   General: No focal deficit present.  ?   Mental Status: He is oriented to person, place, and time.  ?Psychiatric:     ?   Mood and Affect: Mood normal.     ?  Behavior: Behavior normal.  ? ? ?ED Results / Procedures / Treatments   ?Labs ?(all labs ordered are listed, but only abnormal results are displayed) ?Labs Reviewed - No data to display ? ?EKG ?None ? ?Radiology ?DG Wrist Complete Left ? ?Result Date: 01/09/2022 ?CLINICAL DATA:  Wrist pain EXAM: LEFT WRIST - COMPLETE 3+ VIEW COMPARISON:  None. FINDINGS: There is no evidence of fracture or dislocation. There is no evidence of arthropathy or other focal bone abnormality. Soft tissues are unremarkable. IMPRESSION: Negative. Electronically Signed   By: Jasmine Pang M.D.   On: 01/09/2022 18:46  ? ?DG Hand 2 View Left ? ?Result Date: 01/09/2022 ?CLINICAL DATA:  Question to wall EXAM: LEFT HAND - 2 VIEW COMPARISON:  None. FINDINGS: Dorsal dislocation base of the fifth proximal phalanx with respect to the head of fifth metacarpal. No fracture IMPRESSION:  Dorsal dislocation at fifth MCP joint Electronically Signed   By: Jasmine Pang M.D.   On: 01/09/2022 18:45   ? ?Procedures ?Reduction of dislocation ? ?Date/Time: 01/09/2022 9:08 PM ?Performed by: Charlynne Pander, MD ?Authorized by: Charlynne Pander, MD  ?Consent: Verbal consent obtained. ?Risks and benefits: risks, benefits and alternatives were discussed ?Consent given by: patient ?Patient understanding: patient states understanding of the procedure being performed ? ?Sedation: ?Patient sedated: no ? ?Patient tolerance: patient tolerated the procedure well with no immediate complications ?Comments: I was able to reduce the left MCP joint with manual traction ? ?  ? ? ?Medications Ordered in ED ?Medications  ?oxyCODONE-acetaminophen (PERCOCET/ROXICET) 5-325 MG per tablet 1 tablet (1 tablet Oral Given 01/09/22 2053)  ? ? ?ED Course/ Medical Decision Making/ A&P ?  ?                        ?Medical Decision Making ?Jesus Lewis is a 31 y.o. male here presenting with left fifth finger injury.  Concern for possible dislocation versus fracture.  We will get x-rays and reassess ? ?9:09 PM ?X-ray showed left fifth finger dislocation.  I was able to annually reduced it.  Ulnar gutter was placed by staff.  ?  ?9:47 PM ?Postreduction x-ray reviewed and the finger is successfully reduced.  We will have patient follow-up with hand outpatient. No sports for a month  ? ?Problems Addressed: ?Dislocation of finger, initial encounter: acute illness or injury ? ?Amount and/or Complexity of Data Reviewed ?Radiology: ordered and independent interpretation performed. Decision-making details documented in ED Course. ? ?Risk ?Prescription drug management. ? ?Final Clinical Impression(s) / ED Diagnoses ?Final diagnoses:  ?None  ? ? ?Rx / DC Orders ?ED Discharge Orders   ? ? None  ? ?  ? ? ?  ?Charlynne Pander, MD ?01/09/22 2148 ? ?

## 2022-01-09 NOTE — Discharge Instructions (Addendum)
You have a finger dislocation. ? ?Please keep the splint on for a week and no sports for a month ? ?Apply ice for the swelling.  Take Tylenol or Motrin for pain ? ?See hand surgery for follow-up ? ?Return to ER if you have worse hand pain, fingers turning blue ?

## 2022-01-09 NOTE — ED Triage Notes (Signed)
Pt was playing basketball today and was pushed into a wall. Pt's Left hand and wrist was pushed backward when he was pushed into the wall. Pt heard a pop. ?

## 2023-06-22 IMAGING — DX DG HAND 2V*L*
2 series · 2 of 2 positions shown · non-contrast
Comparison: Film from earlier in the same day.

CLINICAL DATA: Status post reduction

EXAM:
LEFT HAND - 2 VIEW

[hand ap]
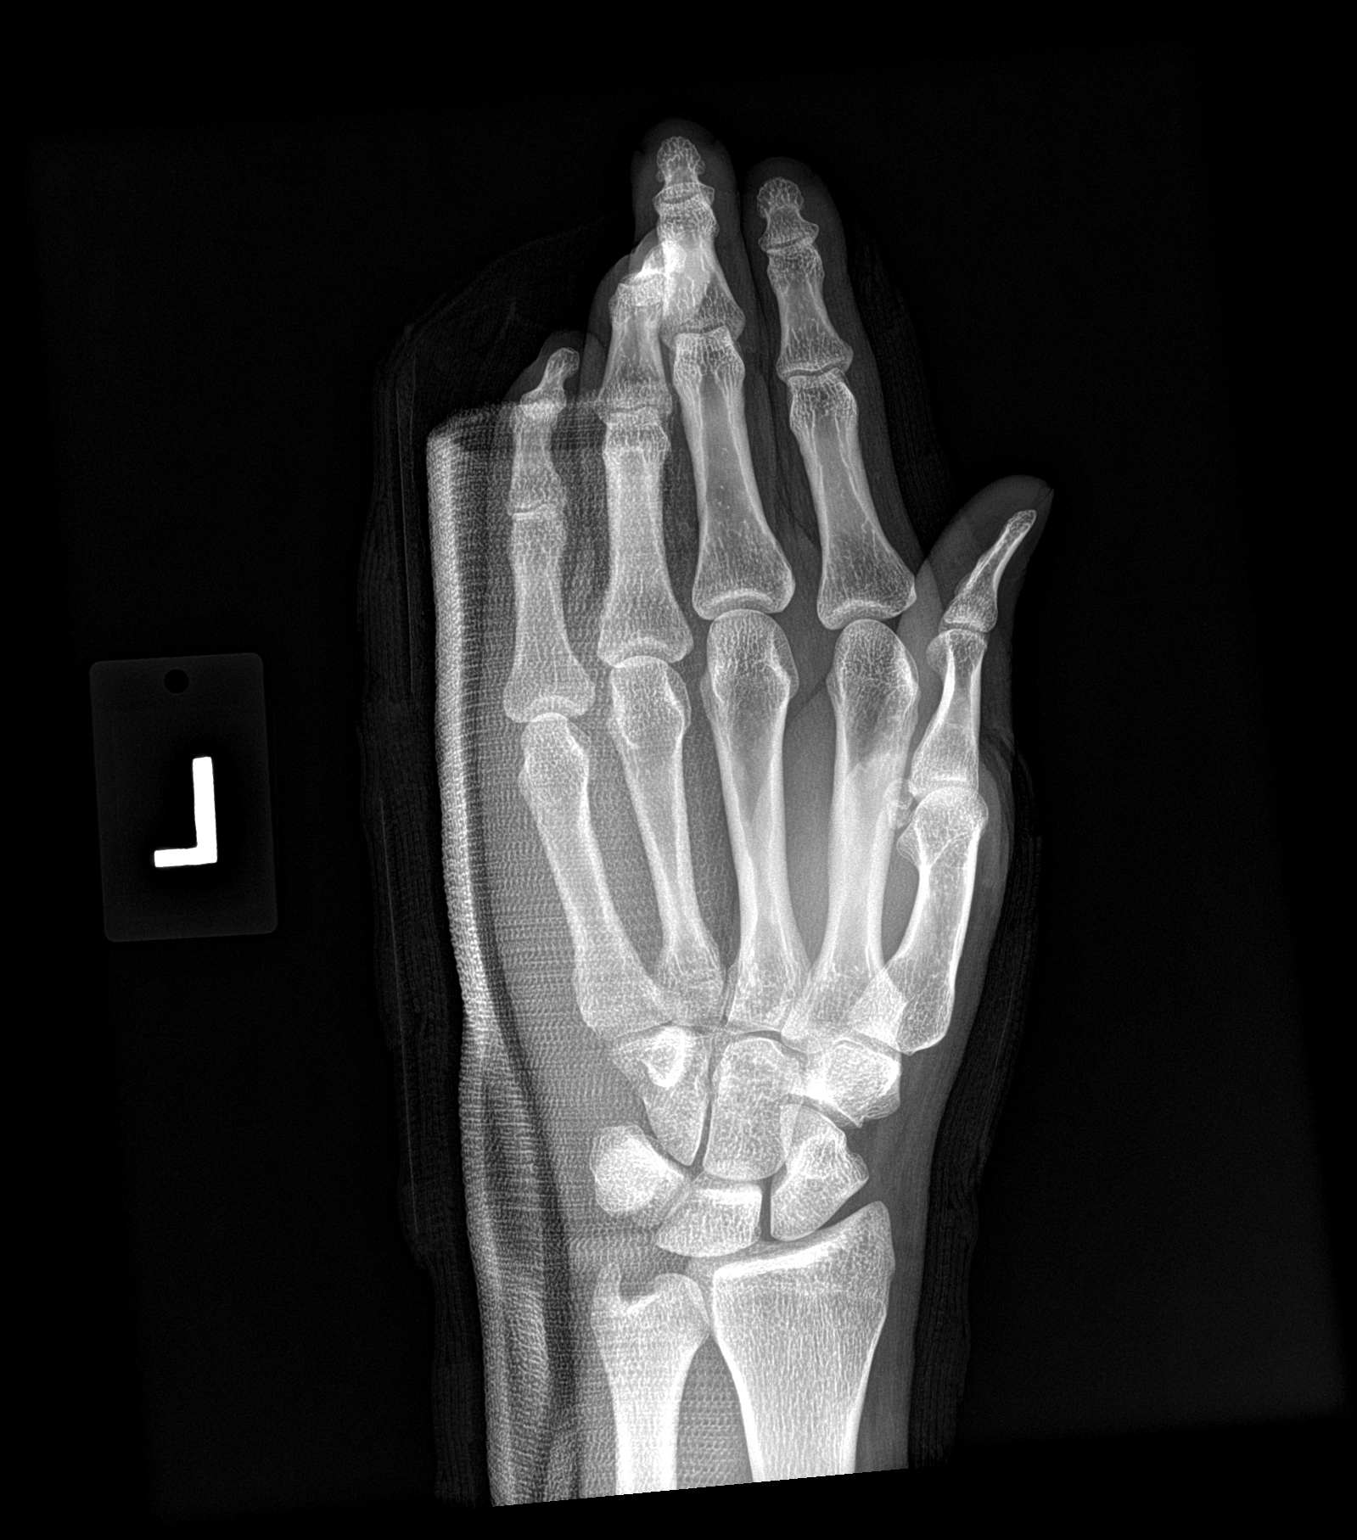

[hand lat]
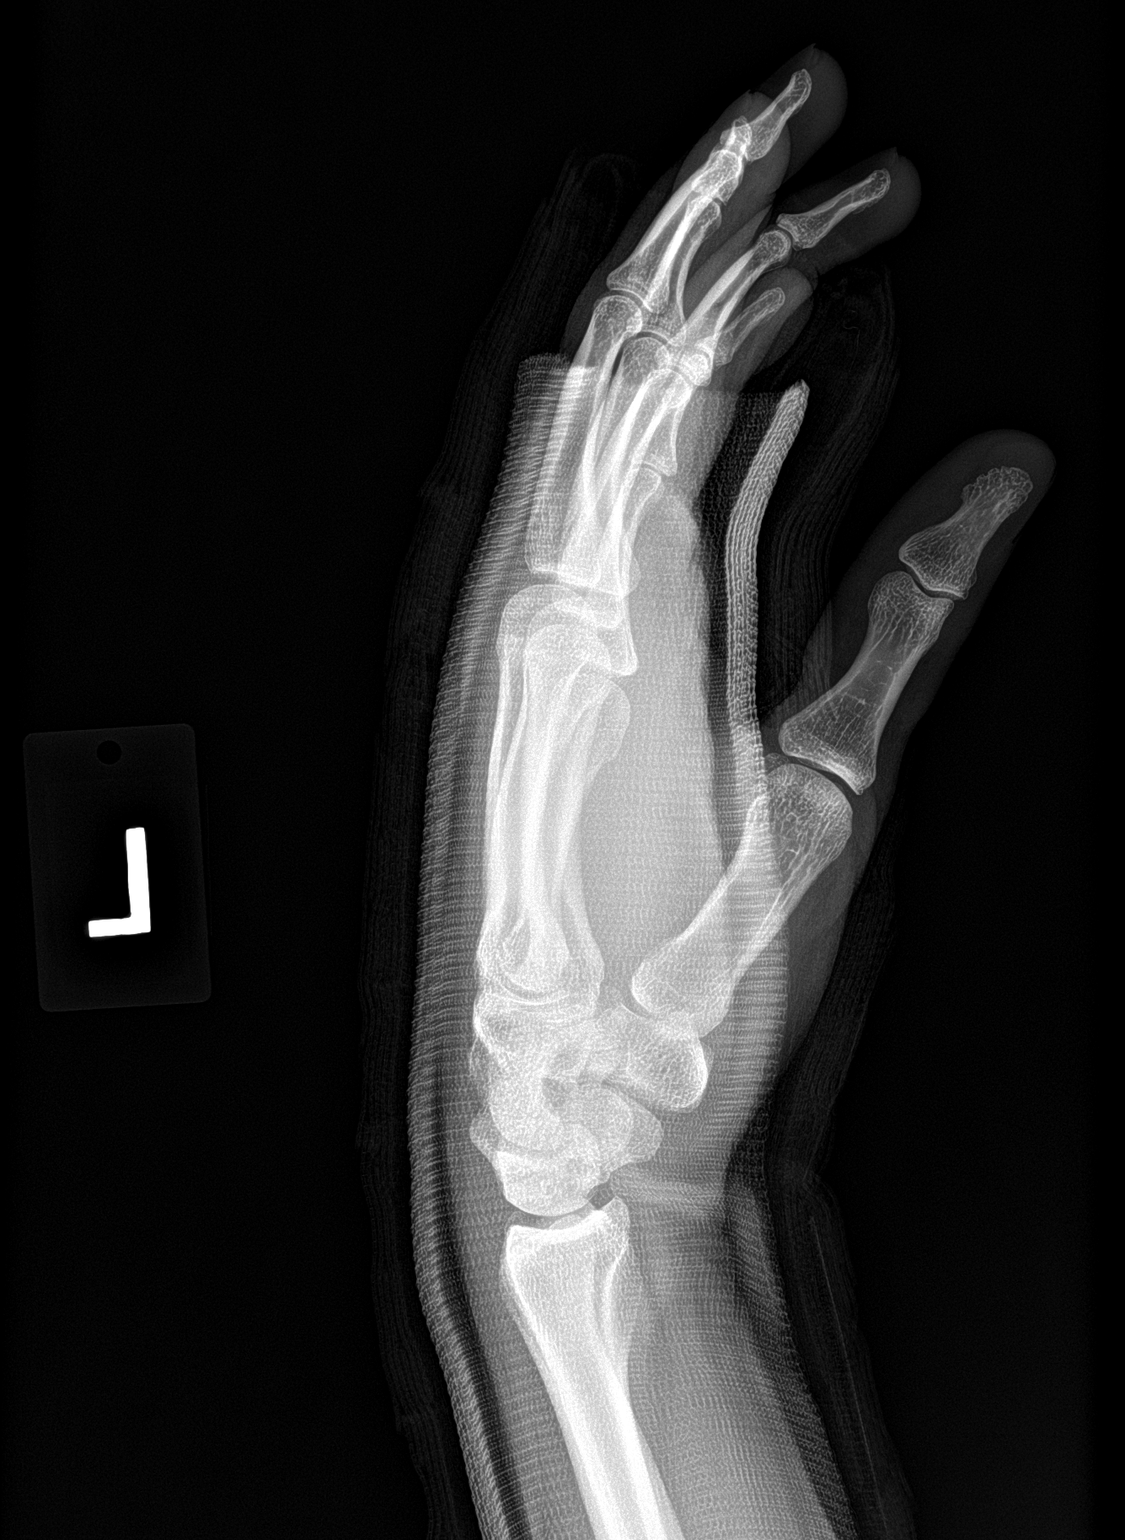

[2 of 2 positions shown; findings below may reference images not displayed]

FINDINGS: Interval reduction of the fifth MCP joint is noted. No acute
fracture or dislocation is seen. No other focal abnormality is
noted.
IMPRESSION: Interval reduction without acute abnormality.

## 2023-10-31 ENCOUNTER — Encounter (HOSPITAL_COMMUNITY): Payer: Self-pay

## 2023-10-31 ENCOUNTER — Emergency Department (HOSPITAL_COMMUNITY): Payer: Medicaid Other

## 2023-10-31 ENCOUNTER — Other Ambulatory Visit: Payer: Self-pay

## 2023-10-31 ENCOUNTER — Emergency Department (HOSPITAL_COMMUNITY)
Admission: EM | Admit: 2023-10-31 | Discharge: 2023-10-31 | Disposition: A | Discharge status: Home / Self Care | Payer: Medicaid Other | Attending: Emergency Medicine | Admitting: Emergency Medicine

## 2023-10-31 DIAGNOSIS — R051 Acute cough: Secondary | ICD-10-CM | POA: Diagnosis not present

## 2023-10-31 DIAGNOSIS — R059 Cough, unspecified: Secondary | ICD-10-CM | POA: Diagnosis present

## 2023-10-31 DIAGNOSIS — J45909 Unspecified asthma, uncomplicated: Secondary | ICD-10-CM | POA: Diagnosis not present

## 2023-10-31 MED ORDER — ALBUTEROL SULFATE HFA 108 (90 BASE) MCG/ACT IN AERS
2.0000 | INHALATION_SPRAY | RESPIRATORY_TRACT | Status: DC | PRN
  Administered 2023-10-31: 2 via RESPIRATORY_TRACT
  Filled 2023-10-31: qty 6.7

## 2023-10-31 MED ORDER — BENZONATATE 100 MG PO CAPS: 100.0000 mg | ORAL_CAPSULE | Freq: Three times a day (TID) | ORAL | 0 refills | Status: AC

## 2023-10-31 NOTE — ED Provider Notes (Signed)
[name] EM[facility]GENCY DEPARTMENT AT [facility] Provider Note   CSN: [id number] Arrival date & time: 10/31/23  0154     History  Chief Complaint  Patient presents with   Shortness of Breath    Jesus Lewis is a 33 y.o. male.  Patient presents the emergency room with a chief complaint of cough.  He states that he had the flu 2 weeks ago and is again had a persistent cough since that time.  Cough is described as dry in nature.  Patient states that he has been playing basketball frequently and that when he plays basketball and irritates his breathing and he begins to have coughing fits.  He denies fever, nausea, vomiting, shortness of breath, other chest pain at this time.  Past medical history significant for asthma   Shortness of Breath      Home Medications Prior to Admission medications   Medication Sig Start Date End Date Taking? Authorizing Provider  benzonatate (TESSALON) 100 MG capsule Take 1 capsule (100 mg total) by mouth every 8 (eight) hours. 10/31/23  Yes Darrick Grinder, PA-C  acetaminophen (TYLENOL) 325 MG tablet Take 650 mg by mouth every 6 (six) hours as needed for mild pain.    [provider]  albuterol (PROVENTIL HFA;VENTOLIN HFA) 108 (90 BASE) MCG/ACT inhaler Inhale 1 puff into the lungs every 6 (six) hours as needed for wheezing or shortness of breath.    [provider]  amoxicillin-clavulanate (AUGMENTIN) 875-125 MG tablet Take 1 tablet by mouth every 12 (twelve) hours. 10/06/21   Smoot, Shawn Route, PA-C  hydrocortisone (ANUSOL-HC) 2.5 % rectal cream Apply rectally 2 times daily 12/02/17   Geoffery Lyons, MD  lidocaine (XYLOCAINE) 5 % ointment Apply 1 application topically 3 (three) times daily as needed. 10/15/21   Cardama, Amadeo Garnet, MD  nitroGLYCERIN (NITROGLYN) 2 % ointment Apply 0.5-1 inches topically 3 (three) times daily. 10/15/21   Nira Conn, MD  omeprazole (PRILOSEC) 40 MG capsule Take 1 capsule (40 mg total) by mouth  daily. 11/08/17   Ward, Layla Maw, DO  promethazine (PHENERGAN) 25 MG tablet Take 1 tablet (25 mg total) by mouth every 6 (six) hours as needed for nausea or vomiting. 11/08/17   Ward, Layla Maw, DO      Allergies    Robitussin nighttime cough dm [doxylamine-dm]    Review of Systems   Review of Systems  Respiratory:  Positive for shortness of breath.     Physical Exam Updated Vital Signs BP 131/78 (BP Location: Right Arm)   Pulse 63   Temp 97.9 F (36.6 C) (Oral)   Resp 16   SpO2 100%  Physical Exam Vitals and nursing note reviewed.  HENT:     Head: Normocephalic and atraumatic.  Cardiovascular:     Rate and Rhythm: Normal rate and regular rhythm.  Pulmonary:     Effort: Pulmonary effort is normal. No respiratory distress.     Breath sounds: Normal breath sounds. No wheezing.  Musculoskeletal:        General: No signs of injury.     Cervical back: Normal range of motion.  Skin:    General: Skin is dry.  Neurological:     Mental Status: He is alert.  Psychiatric:        Speech: Speech normal.        Behavior: Behavior normal.     ED Results / Procedures / Treatments   Labs (all labs ordered are listed, but only  abnormal results are displayed) Labs Reviewed - No data to display  EKG None  Radiology DG Chest 2 View Result Date: 10/31/2023 CLINICAL DATA:  Shortness of breath. EXAM: CHEST - 2 VIEW COMPARISON:  10/14/2014 FINDINGS: Normal cardiomediastinal silhouette. Hyperinflation. Mild bronchial wall thickening. No focal consolidation, pleural effusion, or pneumothorax. No displaced rib fractures. IMPRESSION: Hyperinflation and bronchial wall thickening which can be seen with bronchitis/reactive airways. Electronically Signed   By: Minerva Fester M.D.   On: 10/31/2023 03:23    Procedures Procedures    Medications Ordered in ED Medications  albuterol (VENTOLIN HFA) 108 (90 Base) MCG/ACT inhaler 2 puff (has no administration in time range)    ED Course/  Medical Decision Making/ A&P                                 Medical Decision Making Amount and/or Complexity of Data Reviewed Radiology: ordered.  Risk Prescription drug management.   This patient presents to the ED for concern of cough, this involves an extensive number of treatment options, and is a complaint that carries with it a high risk of complications and morbidity.  The differential diagnosis includes but is not limited to bronchitis, asthma exacerbation, pneumonia, others   Co morbidities that complicate the patient evaluation  Asthma   Additional history obtained:  Additional history obtained from patient's wife on phone   Imaging Studies ordered:  I ordered imaging studies including chest x-ray I independently visualized and interpreted imaging which showed  Hyperinflation and bronchial wall thickening which can be seen with  bronchitis/reactive airways.   I agree with the radiologist interpretation   Social Determinants of Health:  Patient has medicaid for his primary health insurance and has no primary care provider   Test / Admission - Considered:  Patient symptoms most consistent with bronchitis.  Plan to discharge home with Jerilynn Som for cough.  Patient discharged in stable condition with return precautions.         Final Clinical Impression(s) / ED Diagnoses Final diagnoses:  Acute cough    Rx / DC Orders ED Discharge Orders          Ordered    benzonatate (TESSALON) 100 MG capsule  Every 8 hours        10/31/23 0322              Darrick Grinder, PA-C 10/31/23 0341    Sabas Sous, MD 10/31/23 [phone]

## 2023-10-31 NOTE — ED Triage Notes (Signed)
Pt. Arrives POV c/o SOB states that he had the flu 2 weeks ago. He has tried to jump back into his normal routine, but has been unable to do so without becoming SOB. Pt. States that he still has not had an appetite, and has started to cough up blood in his phlegm. HX of asthma

## 2023-10-31 NOTE — Discharge Instructions (Addendum)
You were evaluated today for a cough consistent with bronchitis.  Your workup did not show a pneumonia on the chest x-ray.  I prescribed cough medication to be taken at home as directed.  If you develop any life-threatening symptoms please return to the emergency department.
